# Patient Record
Sex: Female | Born: 1935
Health system: Southern US, Community
[De-identification: ages and names within clinical notes are randomized; demographics above are authoritative.]

## PROBLEM LIST (undated history)

## (undated) DIAGNOSIS — E559 Vitamin D deficiency, unspecified: Secondary | ICD-10-CM

## (undated) DIAGNOSIS — K222 Esophageal obstruction: Secondary | ICD-10-CM

## (undated) DIAGNOSIS — H9190 Unspecified hearing loss, unspecified ear: Secondary | ICD-10-CM

## (undated) DIAGNOSIS — C801 Malignant (primary) neoplasm, unspecified: Secondary | ICD-10-CM

## (undated) DIAGNOSIS — C189 Malignant neoplasm of colon, unspecified: Secondary | ICD-10-CM

## (undated) DIAGNOSIS — I4719 Other supraventricular tachycardia: Secondary | ICD-10-CM

## (undated) DIAGNOSIS — R42 Dizziness and giddiness: Secondary | ICD-10-CM

## (undated) DIAGNOSIS — I4891 Unspecified atrial fibrillation: Secondary | ICD-10-CM

## (undated) DIAGNOSIS — I878 Other specified disorders of veins: Secondary | ICD-10-CM

## (undated) DIAGNOSIS — I471 Supraventricular tachycardia: Secondary | ICD-10-CM

## (undated) DIAGNOSIS — K635 Polyp of colon: Secondary | ICD-10-CM

## (undated) DIAGNOSIS — C55 Malignant neoplasm of uterus, part unspecified: Secondary | ICD-10-CM

## (undated) DIAGNOSIS — K449 Diaphragmatic hernia without obstruction or gangrene: Secondary | ICD-10-CM

## (undated) DIAGNOSIS — L111 Transient acantholytic dermatosis [Grover]: Secondary | ICD-10-CM

## (undated) DIAGNOSIS — C3490 Malignant neoplasm of unspecified part of unspecified bronchus or lung: Secondary | ICD-10-CM

## (undated) DIAGNOSIS — I10 Essential (primary) hypertension: Secondary | ICD-10-CM

## (undated) DIAGNOSIS — M810 Age-related osteoporosis without current pathological fracture: Secondary | ICD-10-CM

## (undated) DIAGNOSIS — H8110 Benign paroxysmal vertigo, unspecified ear: Secondary | ICD-10-CM

## (undated) DIAGNOSIS — I509 Heart failure, unspecified: Secondary | ICD-10-CM

## (undated) DIAGNOSIS — R55 Syncope and collapse: Secondary | ICD-10-CM

## (undated) DIAGNOSIS — IMO0002 Reserved for concepts with insufficient information to code with codable children: Secondary | ICD-10-CM

## (undated) HISTORY — DX: Malignant (primary) neoplasm, unspecified: C80.1

## (undated) HISTORY — DX: Other specified disorders of veins: I87.8

## (undated) HISTORY — PX: VEIN LIGATION AND STRIPPING: SHX2653

## (undated) HISTORY — PX: COLECTOMY: SHX59

## (undated) HISTORY — PX: LOBECTOMY: SHX5089

## (undated) HISTORY — DX: Dizziness and giddiness: R42

## (undated) HISTORY — PX: OTHER SURGICAL HISTORY: SHX169

## (undated) HISTORY — PX: INNER EAR SURGERY: SHX679

## (undated) HISTORY — DX: Malignant neoplasm of uterus, part unspecified: C55

## (undated) HISTORY — PX: TOTAL KNEE ARTHROPLASTY: SHX125

## (undated) HISTORY — DX: Esophageal obstruction: K22.2

## (undated) HISTORY — PX: HEMORRHOID SURGERY: SHX153

## (undated) HISTORY — DX: Supraventricular tachycardia: I47.1

## (undated) HISTORY — DX: Malignant neoplasm of colon, unspecified: C18.9

## (undated) HISTORY — DX: Polyp of colon: K63.5

## (undated) HISTORY — DX: Syncope and collapse: R55

## (undated) HISTORY — DX: Other supraventricular tachycardia: I47.19

## (undated) HISTORY — PX: CATARACT EXTRACTION: SUR2

## (undated) HISTORY — PX: TOTAL ABDOMINAL HYSTERECTOMY: SHX209

## (undated) HISTORY — DX: Diaphragmatic hernia without obstruction or gangrene: K44.9

## (undated) HISTORY — DX: Reserved for concepts with insufficient information to code with codable children: IMO0002

---

## 1983-08-25 DIAGNOSIS — C55 Malignant neoplasm of uterus, part unspecified: Secondary | ICD-10-CM

## 1983-08-25 DIAGNOSIS — C801 Malignant (primary) neoplasm, unspecified: Secondary | ICD-10-CM

## 1983-08-25 HISTORY — DX: Malignant neoplasm of uterus, part unspecified: C55

## 1983-08-25 HISTORY — DX: Malignant (primary) neoplasm, unspecified: C80.1

## 1991-08-25 HISTORY — PX: LOBECTOMY: SHX5089

## 1998-01-02 ENCOUNTER — Other Ambulatory Visit: Admission: RE | Admit: 1998-01-02 | Discharge: 1998-01-02 | Payer: Self-pay | Admitting: Oncology

## 1998-07-03 ENCOUNTER — Other Ambulatory Visit: Admission: RE | Admit: 1998-07-03 | Discharge: 1998-07-03 | Payer: Self-pay | Admitting: Internal Medicine

## 1998-08-24 HISTORY — PX: REPLACEMENT TOTAL KNEE: SUR1224

## 1999-04-02 ENCOUNTER — Ambulatory Visit (HOSPITAL_COMMUNITY): Admission: RE | Admit: 1999-04-02 | Discharge: 1999-04-02 | Payer: Self-pay | Admitting: *Deleted

## 1999-04-02 ENCOUNTER — Encounter: Payer: Self-pay | Admitting: *Deleted

## 1999-07-07 ENCOUNTER — Other Ambulatory Visit: Admission: RE | Admit: 1999-07-07 | Discharge: 1999-07-07 | Payer: Self-pay | Admitting: Internal Medicine

## 1999-09-24 ENCOUNTER — Encounter: Payer: Self-pay | Admitting: Specialist

## 1999-09-30 ENCOUNTER — Encounter (INDEPENDENT_AMBULATORY_CARE_PROVIDER_SITE_OTHER): Payer: Self-pay

## 1999-09-30 ENCOUNTER — Inpatient Hospital Stay (HOSPITAL_COMMUNITY): Admission: RE | Admit: 1999-09-30 | Discharge: 1999-10-05 | Payer: Self-pay | Admitting: Specialist

## 2000-07-08 ENCOUNTER — Other Ambulatory Visit: Admission: RE | Admit: 2000-07-08 | Discharge: 2000-07-08 | Payer: Self-pay | Admitting: Internal Medicine

## 2000-08-24 DIAGNOSIS — K635 Polyp of colon: Secondary | ICD-10-CM

## 2000-08-24 HISTORY — DX: Polyp of colon: K63.5

## 2001-06-13 ENCOUNTER — Ambulatory Visit (HOSPITAL_COMMUNITY): Admission: RE | Admit: 2001-06-13 | Discharge: 2001-06-13 | Payer: Self-pay | Admitting: Internal Medicine

## 2004-06-27 ENCOUNTER — Ambulatory Visit: Payer: Self-pay | Admitting: Gastroenterology

## 2004-07-09 ENCOUNTER — Ambulatory Visit: Payer: Self-pay | Admitting: Gastroenterology

## 2005-08-24 DIAGNOSIS — K222 Esophageal obstruction: Secondary | ICD-10-CM

## 2005-08-24 DIAGNOSIS — K449 Diaphragmatic hernia without obstruction or gangrene: Secondary | ICD-10-CM

## 2005-08-24 HISTORY — DX: Esophageal obstruction: K22.2

## 2005-08-24 HISTORY — DX: Diaphragmatic hernia without obstruction or gangrene: K44.9

## 2006-04-16 ENCOUNTER — Ambulatory Visit: Payer: Self-pay | Admitting: Gastroenterology

## 2006-04-20 ENCOUNTER — Ambulatory Visit (HOSPITAL_COMMUNITY): Admission: RE | Admit: 2006-04-20 | Discharge: 2006-04-20 | Payer: Self-pay | Admitting: Gastroenterology

## 2006-05-03 ENCOUNTER — Ambulatory Visit: Payer: Self-pay | Admitting: Gastroenterology

## 2006-05-12 ENCOUNTER — Ambulatory Visit: Payer: Self-pay | Admitting: Gastroenterology

## 2006-12-17 ENCOUNTER — Ambulatory Visit: Payer: Self-pay | Admitting: Gastroenterology

## 2006-12-20 ENCOUNTER — Encounter: Payer: Self-pay | Admitting: Gastroenterology

## 2006-12-30 ENCOUNTER — Ambulatory Visit: Payer: Self-pay | Admitting: Gastroenterology

## 2006-12-30 LAB — CONVERTED CEMR LAB
Folate: >20 ng/mL
Tissue Transglutaminase Ab, IgA: <3 units (ref <5)
Vitamin B-12: 510 pg/mL (ref 211–911)

## 2007-01-11 ENCOUNTER — Ambulatory Visit: Payer: Self-pay | Admitting: Gastroenterology

## 2007-03-14 ENCOUNTER — Ambulatory Visit: Payer: Self-pay | Admitting: Vascular Surgery

## 2009-06-21 ENCOUNTER — Encounter: Admission: RE | Admit: 2009-06-21 | Discharge: 2009-06-21 | Payer: Self-pay | Admitting: Internal Medicine

## 2009-07-26 ENCOUNTER — Encounter: Admission: RE | Admit: 2009-07-26 | Discharge: 2009-07-26 | Payer: Self-pay | Admitting: Internal Medicine

## 2010-04-25 ENCOUNTER — Ambulatory Visit: Payer: Self-pay | Admitting: Cardiology

## 2011-01-06 NOTE — Procedures (Signed)
DUPLEX DEEP VENOUS EXAM - LOWER EXTREMITY   INDICATION:  Right lower extremity pain and swelling.   HISTORY:  Edema:  Yes, right lower extremity.  Trauma/Surgery:  Right greater saphenous vein tie-off.  Pain:  Yes  PE:  No  Previous DVT:  No  Anticoagulants:  No  Other:   DUPLEX EXAM:                CFV   SFV   PopV  PTV    GSV                R  L  R  L  R  L  R   L  R  L  Thrombosis    o  o  o  o  o  o  o   o  o  o  Spontaneous   +  +  +  +  +  +  +   +  +  +  Phasic        +  +  +  +  +  +  +   +  +  +  Augmentation  +  +  +  +  +  +  +   +  +  +  Compressible  +  +  +  +  +  +  +   +  +  +  Competent     +  +  +  +  +  +  +   +  +  +   Legend:  + - yes  o - no  p - partial  D - decreased   IMPRESSION:  1. The bilateral lower extremity deep venous system was imaged,      Dopplered, and shows no evidence of deep vein thrombosis.  2. Evidence of acute superficial thrombus in right vein of Giacomini      off of the greater saphenous vein from mid thigh throughout lesser      saphenous vein as well.    _____________________________  Di Kindle. Edilia Bo, M.D.   AS/MEDQ  D:  03/14/2007  T:  03/14/2007  Job:  [id number]

## 2011-01-06 NOTE — Assessment & Plan Note (Signed)
[name]                         GASTROENTEROLOGY OFFICE NOTE   NAME:[name], [name]              MRN:          [id number]  DATE:01/11/2007                            DOB:          August 12, 1924    [name] continues to complain of abdominal gas and bloating but not true  diarrhea. Simple attempts to treat her with Align probiotic therapy were  unsuccessful. Lab data from last visit showed normal B12 and folate  levels.   I still think this patient most likely has bacterial overgrowth syndrome  related to her subtotal colectomy. I have convinced her I think to take  Xifaxan 400 mg t.i.d. for 10 days for continued probiotic therapy and  will continue this as before. She is to continue her other medications  and followup with Dr. Jacky Kindle as previously planned.     Vania Rea. Jarold Motto, MD, Caleen Essex, FAGA  Electronically Signed    DRP/MedQ  DD: 01/11/2007  DT: 01/11/2007  Job #: [id number]   cc:   Geoffry Paradise, M.D.

## 2011-01-06 NOTE — Assessment & Plan Note (Signed)
[facility]                         GASTROENTEROLOGY OFFICE NOTE   NAME:[name], [name]              MRN:          [id number]  DATE:12/30/2006                            DOB:          [birth year]    HISTORY OF PRESENT ILLNESS:  [name] is an 77 year old white female  resident of [facility].  She is self-referred today for evaluation of  excessive abdominal gas, bloating, excessive flatus with bowel  movements.   I have known [name] for many years, and she has had several malignancies  including a hysterectomy for superficial endometrial cancer in 1985,  right upper lobe lobectomy in December 1993 for T2N0M0 adenocarcinoma  and status post subtotal colectomy in December 1994 for a B2 cecal  lesion as well as a polyp in the distal transverse colon with sever  atypia.  She has been followed by Dr. Jacky Kindle and Dr. Marikay Alar Magrinat for  many years.   [name]'s last colonoscopy was in November 2002 and was unremarkable with  evidence of a subtotal colectomy.  Another colonoscopy was done in  November 2005 and was negative.  I did perform endoscopy in September  2007 because of acid reflux symptoms and dysphagia, and she had a peptic  stricture of her esophagus and as dilated.  Over the last year, she has  complained of excessive abdominal gas, bloating, passage of explosive  flatus.  She has tried over-the-counter lactobacillus preparation  without improvement.  She denies steatorrhea, weight loss or other  gastrointestinal complaints.  She follows a regular diet.  She says she  does not eat gaseous substances.  She suffers from osteoporosis, is on  Os-Cal and Fosamax.  Her recent stool exam showed a normal culture,  negative exam for parasites and a negative C. difficile toxin.   She saw Dr. Jacky Kindle yesterday with a salivary gland infection on her  right parotid area and is on doxycycline b.i.d. for 10 days.  She  otherwise denies recent or  frequent antibiotic use.  She also, today,  denies and cardiopulmonary complaints.  She does suffer from [name]  disease which is apparently a pruritus macular papular-type rash which  was biopsied by Dr. Dorinda Hill.  She denies other dermatologic  problems.  She is on no other medications at this time except for  multivitamins.   PHYSICAL EXAMINATION:  GENERAL:  Healthy-appearing white female,  appearing her stated age, in no distress.  VITAL SIGNS:  Weight 134 pounds which she says is her normal weight.  Blood pressure 140/82, pulse 68 and regular.  ABDOMEN:  Flat and nontender without hepatosplenomegaly or masses.  There was a midline abdominal scar that was well-healed.  Bowel sounds  were normal.  RECTAL:  Deferred.   ASSESSMENT:  I suspect that [name] has bacterial overgrowth syndrome  associated with her subtotal colectomy.  As mentioned above, stool exams  have otherwise been negative.  She certainly has no other reason to have  chronic malabsorption, but we will consider celiac disease.   RECOMMENDATIONS:  1. Check B-12 level and celiac profile.  2. Finish 10 days of doxycycline along with Align Probiotic Therapy.  3.  If diarrhea and gas persist after doxycycline therapy, I have asked      her to take Xifaxin 400 mg t.i.d. for 10 days along with Align      Probiotic Therapy.  4. Low gas diet and usual restrictions of nonabsorbable      carbohydrates in her diet.     Vania Rea. Jarold Motto, MD, Caleen Essex, FAGA  Electronically Signed    DRP/MedQ  DD: 12/30/2006  DT: 12/30/2006  Job #: [id number]   cc:   Geoffry Paradise, M.D.  Dorinda Hill, M.D.  Valentino Hue. Magrinat, M.D.

## 2011-01-09 NOTE — Assessment & Plan Note (Signed)
[facility]                           GASTROENTEROLOGY OFFICE NOTE   NAME:Bistline, [name]              MRN:          [id number]  DATE:04/16/2006                            DOB:          1923/10/29    [name] is an 77 year old retired white female referred for evaluation of  dysphagia.   [name], in retrospect, has a long history of some coughing up of  partially digested food products on periodic occasions.  Since February 21, 2006, she has had dysphagia for solids in her retropharyngeal area but not  for liquids.  She denies reflux symptoms or any upper GI complaints  whatsoever.  She has no anorexia or weight loss.  She does not smoke but  uses two glasses of wine a day.  She has not had previous endoscopies or  barium studies.  She does have trouble swallowing her Fosamax pills, has  quit taking her Os-Cal pills because of the dysphagia.   This patient has had multiple cancers in the past and has had a previous  right upper lobe lobectomy in December 1993 for T2, N0 adenocarcinoma and  also a subtotal colectomy in 1994, for B2 cecal lesion.  She has had no  adjuvant therapy given.  She also had a hysterectomy in 1985 for a  superficial endometrial carcinoma.  She is followed closely by Dr. Jacky Kindle.  Her last colonoscopy exam was in November 2005, and showed a subtotal  colectomy that otherwise was normal.   PAST MEDICAL HISTORY:  Otherwise noncontributory except as mentioned above.   PAST SURGICAL HISTORY:  She has had a previous appendectomy.   FAMILY HISTORY:  Entirely noncontributory.   SOCIAL HISTORY:  Patient is married and lives with her husband at [facility]. She has a high school education.  She does not smoke but uses two  glasses of red wine a day.   REVIEW OF SYSTEMS:  Otherwise noncontributory except for some complaints of  some excessive thirst.  She denies cardiovascular or neurologic or pulmonary  complaints at this time.  She has never had CVA, strokes, suffers from no  known neuromuscular diseases.  She says her appetite is good and her weight  has been stable.   PHYSICAL EXAMINATION:  GENERAL APPEARANCE:  She is an elderly-appearing  white female who is hard of hearing but is in no acute distress.  VITAL SIGNS:  She is 5 feet 2 inches tall and weighs 129 pounds.  Blood  pressure 146/76 and pulse 78 and regular.  HEENT:  Examination of the oropharynx was somewhat difficult because of the  rather marked gag reflex, but I could not appreciate any definite masses or  lesions.  NECK:  Examination was unremarkable without thyromegaly or masses noted.  CHEST:  Entirely clear.  CARDIOVASCULAR:  She appeared to be in a regular rhythm without significant  murmurs, rubs, or gallops.  ABDOMEN:  I could not appreciate hepatosplenomegaly, abdominal masses or  tenderness.  Bowel sounds were normal.  EXTREMITIES:  Her upper extremities were unremarkable.  NEUROLOGIC:  Mental status was clear.   ASSESSMENT:  I think that Mrs.  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  However, with her history of multiple malignancies, it is  certainly possible that she could have esophageal carcinoma and she  definitely needs further evaluation.   RECOMMENDATIONS:  We have gone ahead and set her up for cine esophagogram  and barium swallow, proceed accordingly.  She may well need endoscopic exam  and dilation.  Discontinue her other medications and follow up with Dr.  Jacky Kindle as previously planned.                                   Vania Rea. Jarold Motto, MD, Clementeen Graham, [place]   DRP/MedQ  DD:  04/16/2006  DT:  04/16/2006  Job #:  [id number]   cc:   Geoffry Paradise, MD

## 2011-03-18 ENCOUNTER — Other Ambulatory Visit: Payer: Self-pay | Admitting: Cardiology

## 2011-03-18 NOTE — Telephone Encounter (Signed)
escribe medication per fax request  

## 2011-05-11 ENCOUNTER — Encounter: Payer: Self-pay | Admitting: Cardiology

## 2011-05-13 ENCOUNTER — Ambulatory Visit: Payer: Self-pay | Admitting: Cardiology

## 2011-05-20 ENCOUNTER — Ambulatory Visit (INDEPENDENT_AMBULATORY_CARE_PROVIDER_SITE_OTHER): Payer: Medicare Other | Admitting: Cardiology

## 2011-05-20 ENCOUNTER — Encounter: Payer: Self-pay | Admitting: Cardiology

## 2011-05-20 VITALS — BP 130/82 | HR 80 | Ht 60.0 in | Wt 123.4 lb

## 2011-05-20 DIAGNOSIS — I471 Supraventricular tachycardia: Secondary | ICD-10-CM | POA: Insufficient documentation

## 2011-05-20 DIAGNOSIS — I4719 Other supraventricular tachycardia: Secondary | ICD-10-CM | POA: Insufficient documentation

## 2011-05-20 NOTE — Progress Notes (Signed)
   [name] Date of Birth: [birth year]   History of Present Illness: [name] is seen for yearly followup. She has a history of PAT. She has been on beta blockers for the last 3 years with no recurrence. She is now 77 years old. She is doing well. She has had no new medical problems this past year. She still complains of some intermittent vertigo which has been a long-standing problem.  Current Outpatient Prescriptions on File Prior to Visit  Medication Sig Dispense Refill  . Acetaminophen (TYLENOL PO) Take by mouth as needed.        Marland Kitchen aspirin 81 MG tablet Take 81 mg by mouth daily.        . Calcium Carbonate Antacid (TUMS PO) Take by mouth.        . Calcium Carbonate-Vitamin D (OSCAL 500/200 D-3 PO) Take by mouth.        Marland Kitchen lisinopril (PRINIVIL,ZESTRIL) 20 MG tablet Take 20 mg by mouth daily.        . metoprolol tartrate (LOPRESSOR) 25 MG tablet TAKE 1/2 TABLET DAILY  15 tablet  5  . Multiple Vitamin (MULTI-VITAMIN PO) Take by mouth.          Allergies  Allergen Reactions  . Adhesive (Tape)   . Celebrex (Celecoxib)   . Neomycin   . Penicillins Hives    Past Medical History  Diagnosis Date  . Atrial tachycardia     paroxysmal  . Venous stasis     chronic  . Cancer     Lung--Status post right upper lobectomy  . Uterine cancer     complete hysterectomy  . Colon cancer     Colectomy  . Syncope and collapse     Pre-syncope  . Vertigo   . Compression fracture     Past Surgical History  Procedure Date  . Lobectomy     Right  . Colectomy   . Total knee arthroplasty   . Cataract extraction   . Inner ear surgery   . Vein ligation and stripping   . Total abdominal hysterectomy   . Esophageal dilitation     History  Smoking status  . Unknown If Ever Smoked  Smokeless tobacco  . Not on file    History  Alcohol Use  . Yes    Family History  Problem Relation Age of Onset  . Asthma Father   . Emphysema Brother   . Emphysema Brother     Review  of Systems: The review of systems is positive for history of compression fractures spine. She has intermittent vertigo. All other systems were reviewed and are negative.  Physical Exam: BP 130/82  Pulse 80  Ht 5' (1.524 m)  Wt 123 lb 6.4 oz (55.974 kg)  BMI 24.10 kg/m2 She is a pleasant elderly white female in no acute distress. Her HEENT exam is unremarkable. She has no JVD or bruits. Lungs are clear. Cardiac exam reveals a soft apical systolic murmur. Abdomen is soft and nontender. She has no edema. She has multiple superficial varicosities in her lower extremities. Pedal pulses are good. Her neurologic exam is remarkable for difficulty hearing. LABORATORY DATA:   Assessment / Plan:

## 2011-05-20 NOTE — Assessment & Plan Note (Signed)
She has had no recurrence for the past 77 years old metoprolol. Her blood pressure is in excellent control. I've recommended she continue on her current therapy indefinitely and I'll see her back as needed.

## 2011-05-20 NOTE — Patient Instructions (Addendum)
Continue your current medications.  Avoid caffeine.  I will see you again as needed.

## 2013-01-06 ENCOUNTER — Encounter: Payer: Self-pay | Admitting: Gastroenterology

## 2013-01-10 ENCOUNTER — Encounter: Payer: Self-pay | Admitting: *Deleted

## 2013-01-13 ENCOUNTER — Ambulatory Visit (INDEPENDENT_AMBULATORY_CARE_PROVIDER_SITE_OTHER): Payer: Medicare Other | Admitting: Gastroenterology

## 2013-01-13 ENCOUNTER — Encounter: Payer: Self-pay | Admitting: Gastroenterology

## 2013-01-13 VITALS — BP 136/78 | HR 67 | Ht 60.0 in | Wt 123.4 lb

## 2013-01-13 DIAGNOSIS — R195 Other fecal abnormalities: Secondary | ICD-10-CM

## 2013-01-13 DIAGNOSIS — Z9049 Acquired absence of other specified parts of digestive tract: Secondary | ICD-10-CM

## 2013-01-13 DIAGNOSIS — Z85048 Personal history of other malignant neoplasm of rectum, rectosigmoid junction, and anus: Secondary | ICD-10-CM

## 2013-01-13 DIAGNOSIS — Z9889 Other specified postprocedural states: Secondary | ICD-10-CM

## 2013-01-13 DIAGNOSIS — K6389 Other specified diseases of intestine: Secondary | ICD-10-CM

## 2013-01-13 MED ORDER — RIFAXIMIN 550 MG PO TABS: 550.0000 mg | ORAL_TABLET | Freq: Two times a day (BID) | ORAL | Status: DC

## 2013-01-13 NOTE — Patient Instructions (Addendum)
You have been scheduled for a flexible sigmoidoscopy. Please follow the written instructions given to you at your visit today. If you use inhalers (even only as needed), please bring them with you on the day of your procedure.   Please purchase Align over the counter. This puts good bacteria back into your colon. You should take 1 capsule by mouth once daily for ten days.  We have given you samples of the following medication to take: Xifaxan, please take one tablet by mouth twice daily for ten days.

## 2013-01-13 NOTE — Progress Notes (Signed)
History of Present Illness:  This is a mildly demented 79 year old Caucasian female who has had subtotal colectomy in 1994 for cecal colon carcinoma.  She's had several followup colonoscopy which has shown a small amount of rectosigmoid remaining which has been normal,last exam 2005.  Really denies any GI complaints except for gas, bloating, which is been present ever since her surgery and has been documented to be secondary to bacterial overgrowth syndrome related to her right colectomy and anatomical changes.  She has responded well in the past to Xifaxan and oral antibiotics,but I have not seen her in several years..  She denies melena but occasionally have a tinge of blood if she strains and feels a" hemorrhoids".  Recent hemoglobin and hematocrit were normal, and there is questionable trace guaiac positive stool Hemoccult.  She is on multiple medications listed and reviewed her chart.  Her appetite is good and weight is stable.  He denies upper GI or hepatobiliary complaints.  Previous endoscopy in 2007 showed a small hiatal hernia.  The patient also has had previous right upper lobe lobectomy in 1993 for adenocarcinoma, and her cecal lesion was a B-2, she's had a hysterectomy in 1985 for endometrial carcinoma, and suffers from hypertension.  I have reviewed this patient's present history, medical and surgical past history, allergies and medications.     ROS:   All systems were reviewed and are negative unless otherwise stated in the HPI.    Physical Exam: At pressure 130/78 pulse 67 and regular weight 123 with a BMI of 24.1. General well developed well nourished patient in no acute distress, appearing their stated age Eyes PERRLA, no icterus, fundoscopic exam per opthamologist Skin no lesions noted Neck supple, no adenopathy, no thyroid enlargement, no tenderness Chest clear to percussion and auscultation Heart no significant murmurs, gallops or rubs noted Abdomen no hepatosplenomegaly masses  or tenderness, BS normal.  Rectal inspection normal no fissures, or fistulae noted.  No masses or tenderness on digital exam. Stool guaiac negative. Extremities no acute joint lesions, edema, phlebitis or evidence of cellulitis. Neurologic patient oriented x 3, cranial nerves intact, no focal neurologic deficits noted. Psychological mental status normal and normal affect.  Mild dementia   Assessment and plan: Probable false positive Hemoccult card.  Stool today is entirely negative and I did add physical exam and rectal exam which were normal.  She is having almost all of her colon removed, and I do not feel at her age with a normal hemoglobin and hematocrit that she needs followup "colonoscopy",but have scheduled flexible sigmoidoscopy which should be adequate to see her rectosigmoid area.  She is on aspirin 81 mg a day which also can give a false positive stool.  For her bacterial overgrowth syndrome I have prescribe Xifaxan 550 mg twice a day for 10 days along with Align probiotics.  She is to call in 2 weeks' time for progress report in terms of her gas, bloating, and vague GI symptoms otherwise.  Past continue other medications by Dr. Jacky Kindle as listed and reviewed.  No diagnosis found.

## 2013-01-19 ENCOUNTER — Telehealth: Payer: Self-pay | Admitting: *Deleted

## 2013-01-19 NOTE — Telephone Encounter (Signed)
Patient had an appointment at 130 pm on 6-11 but Dr. Jarold Motto ask to hold that time because he has dr appointment, he will not be able to start until 2 pm.  I called patient to change appointment and patient said she is fine with coming in at 1pm for a 2 pm I advised patient that the day before procedure instructions are still the same. She does the fleet enema one hr prior(12 pm) to coming and stop clear liquids three hr(10 am) before procedure.  Patient has verbalized understanding and will call with any questions.

## 2013-02-01 ENCOUNTER — Other Ambulatory Visit: Payer: Medicare Other | Admitting: Gastroenterology

## 2013-02-01 ENCOUNTER — Ambulatory Visit (AMBULATORY_SURGERY_CENTER): Payer: Medicare Other | Admitting: Gastroenterology

## 2013-02-01 ENCOUNTER — Encounter: Payer: Self-pay | Admitting: Gastroenterology

## 2013-02-01 VITALS — BP 134/67 | HR 66 | Temp 98.5°F | Resp 22 | Ht 60.0 in | Wt 123.0 lb

## 2013-02-01 DIAGNOSIS — D126 Benign neoplasm of colon, unspecified: Secondary | ICD-10-CM

## 2013-02-01 DIAGNOSIS — Z85048 Personal history of other malignant neoplasm of rectum, rectosigmoid junction, and anus: Secondary | ICD-10-CM

## 2013-02-01 DIAGNOSIS — R197 Diarrhea, unspecified: Secondary | ICD-10-CM

## 2013-02-01 MED ORDER — SODIUM CHLORIDE 0.9 % IV SOLN: 500.0000 mL | INTRAVENOUS | Status: DC

## 2013-02-01 NOTE — Patient Instructions (Addendum)
Discharge instructions given with verbal understanding. Biopsies taken. Resume previous medications. YOU HAD AN ENDOSCOPIC PROCEDURE TODAY AT THE [facility]: Refer to the procedure report that was given to you for any specific questions about what was found during the examination.  If the procedure report does not answer your questions, please call your gastroenterologist to clarify.  If you requested that your care partner not be given the details of your procedure findings, then the procedure report has been included in a sealed envelope for you to review at your convenience later.  YOU SHOULD EXPECT: Some feelings of bloating in the abdomen. Passage of more gas than usual.  Walking can help get rid of the air that was put into your GI tract during the procedure and reduce the bloating. If you had a lower endoscopy (such as a colonoscopy or flexible sigmoidoscopy) you may notice spotting of blood in your stool or on the toilet paper. If you underwent a bowel prep for your procedure, then you may not have a normal bowel movement for a few days.  DIET: Your first meal following the procedure should be a light meal and then it is ok to progress to your normal diet.  A half-sandwich or bowl of soup is an example of a good first meal.  Heavy or fried foods are harder to digest and may make you feel nauseous or bloated.  Likewise meals heavy in dairy and vegetables can cause extra gas to form and this can also increase the bloating.  Drink plenty of fluids but you should avoid alcoholic beverages for 24 hours.  ACTIVITY: Your care partner should take you home directly after the procedure.  You should plan to take it easy, moving slowly for the rest of the day.  You can resume normal activity the day after the procedure however you should NOT DRIVE or use heavy machinery for 24 hours (because of the sedation medicines used during the test).    SYMPTOMS TO REPORT IMMEDIATELY: A gastroenterologist  can be reached at any hour.  During normal business hours, 8:30 AM to 5:00 PM Monday through Friday, call [phone].  After hours and on weekends, please call the GI answering service at [phone] who will take a message and have the physician on call contact you.   Following lower endoscopy (colonoscopy or flexible sigmoidoscopy):  Excessive amounts of blood in the stool  Significant tenderness or worsening of abdominal pains  Swelling of the abdomen that is new, acute  Fever of 100F or higher  FOLLOW UP: If any biopsies were taken you will be contacted by phone or by letter within the next 1-3 weeks.  Call your gastroenterologist if you have not heard about the biopsies in 3 weeks.  Our staff will call the home number listed on your records the next business day following your procedure to check on you and address any questions or concerns that you may have at that time regarding the information given to you following your procedure. This is a courtesy call and so if there is no answer at the home number and we have not heard from you through the emergency physician on call, we will assume that you have returned to your regular daily activities without incident.  SIGNATURES/CONFIDENTIALITY: You and/or your care partner have signed paperwork which will be entered into your electronic medical record.  These signatures attest to the fact that that the information above on your After Visit Summary has been reviewed   and is understood.  Full responsibility of the confidentiality of this discharge information lies with you and/or your care-partner.  

## 2013-02-01 NOTE — Progress Notes (Signed)
Patient did not experience any of the following events: a burn prior to discharge; a fall within the facility; wrong site/side/patient/procedure/implant event; or a hospital transfer or hospital admission upon discharge from the facility. (G8907) Patient did not have preoperative order for IV antibiotic SSI prophylaxis. (G8918)  

## 2013-02-01 NOTE — Op Note (Signed)
[facility] [address] [place], [address]   FLEXIBLE SIGMOIDOSCOPY PROCEDURE REPORT  PATIENT: [name], [name]  MR#: [id number] BIRTHDATE: [birth year] , 89  yrs. old GENDER: Female ENDOSCOPIST: Mardella Layman, MD, [facility] REFERRED BY: PROCEDURE DATE:  02/01/2013 PROCEDURE: ASA CLASS: INDICATIONS: MEDICATIONS:  DESCRIPTION OF PROCEDURE:   inspection of the rectum and perirectal area was unremarkable as was rectal exam.  The rectum was easily intubated with a flexible sigmoidoscope.  The ileocolonic anastomosis was at 25 cm and appeared normal.  There were no mucosal polypoid lesions in the remaining colon including retroflex view of the rectum.  Empiric biopsies were obtained for pathologic exam.  Patient was extubated without difficulty, tolerated this procedure well.        COMPLICATIONS:  None  ENDOSCOPIC IMPRESSION: S/P subtotal colectomy for colon cancer.Marland KitchenMarland KitchenR/O microscopic colitis.  RECOMMENDATIONS:Await biopsy report...continue other meds.   _______________________________ eSignedMardella Layman, MD, [facility] 02/01/2013 1:58 PM

## 2013-02-01 NOTE — Progress Notes (Signed)
Called to room to assist during endoscopic procedure.  Patient ID and intended procedure confirmed with present staff. Received instructions for my participation in the procedure from the performing physician.  

## 2013-02-01 NOTE — Progress Notes (Signed)
Propofol given over incremental dosages 

## 2013-02-02 ENCOUNTER — Telehealth: Payer: Self-pay

## 2013-02-02 NOTE — Telephone Encounter (Signed)
Left message on answering machine. 

## 2013-02-07 ENCOUNTER — Encounter: Payer: Self-pay | Admitting: Gastroenterology

## 2016-09-15 DIAGNOSIS — D0439 Carcinoma in situ of skin of other parts of face: Secondary | ICD-10-CM | POA: Diagnosis not present

## 2016-09-15 DIAGNOSIS — L570 Actinic keratosis: Secondary | ICD-10-CM | POA: Diagnosis not present

## 2016-12-16 DIAGNOSIS — D0439 Carcinoma in situ of skin of other parts of face: Secondary | ICD-10-CM | POA: Diagnosis not present

## 2016-12-16 DIAGNOSIS — Z85828 Personal history of other malignant neoplasm of skin: Secondary | ICD-10-CM | POA: Diagnosis not present

## 2017-01-13 DIAGNOSIS — R8299 Other abnormal findings in urine: Secondary | ICD-10-CM | POA: Diagnosis not present

## 2017-01-13 DIAGNOSIS — M810 Age-related osteoporosis without current pathological fracture: Secondary | ICD-10-CM | POA: Diagnosis not present

## 2017-01-13 DIAGNOSIS — I10 Essential (primary) hypertension: Secondary | ICD-10-CM | POA: Diagnosis not present

## 2017-01-20 DIAGNOSIS — Z6820 Body mass index (BMI) 20.0-20.9, adult: Secondary | ICD-10-CM | POA: Diagnosis not present

## 2017-01-20 DIAGNOSIS — C3490 Malignant neoplasm of unspecified part of unspecified bronchus or lung: Secondary | ICD-10-CM | POA: Diagnosis not present

## 2017-01-20 DIAGNOSIS — S22009S Unspecified fracture of unspecified thoracic vertebra, sequela: Secondary | ICD-10-CM | POA: Diagnosis not present

## 2017-01-20 DIAGNOSIS — I10 Essential (primary) hypertension: Secondary | ICD-10-CM | POA: Diagnosis not present

## 2017-01-20 DIAGNOSIS — Z0000 Encounter for general adult medical examination without abnormal findings: Secondary | ICD-10-CM | POA: Diagnosis not present

## 2017-01-20 DIAGNOSIS — Z1212 Encounter for screening for malignant neoplasm of rectum: Secondary | ICD-10-CM | POA: Diagnosis not present

## 2017-01-20 DIAGNOSIS — M810 Age-related osteoporosis without current pathological fracture: Secondary | ICD-10-CM | POA: Diagnosis not present

## 2017-01-20 DIAGNOSIS — C189 Malignant neoplasm of colon, unspecified: Secondary | ICD-10-CM | POA: Diagnosis not present

## 2017-01-20 DIAGNOSIS — Z1389 Encounter for screening for other disorder: Secondary | ICD-10-CM | POA: Diagnosis not present

## 2017-01-20 DIAGNOSIS — D692 Other nonthrombocytopenic purpura: Secondary | ICD-10-CM | POA: Diagnosis not present

## 2017-01-20 DIAGNOSIS — R413 Other amnesia: Secondary | ICD-10-CM | POA: Diagnosis not present

## 2017-01-20 DIAGNOSIS — C55 Malignant neoplasm of uterus, part unspecified: Secondary | ICD-10-CM | POA: Diagnosis not present

## 2017-01-27 DIAGNOSIS — D0439 Carcinoma in situ of skin of other parts of face: Secondary | ICD-10-CM | POA: Diagnosis not present

## 2017-06-30 DIAGNOSIS — Z961 Presence of intraocular lens: Secondary | ICD-10-CM | POA: Diagnosis not present

## 2017-06-30 DIAGNOSIS — H10413 Chronic giant papillary conjunctivitis, bilateral: Secondary | ICD-10-CM | POA: Diagnosis not present

## 2017-06-30 DIAGNOSIS — H43813 Vitreous degeneration, bilateral: Secondary | ICD-10-CM | POA: Diagnosis not present

## 2017-07-19 DIAGNOSIS — C55 Malignant neoplasm of uterus, part unspecified: Secondary | ICD-10-CM | POA: Diagnosis not present

## 2017-07-19 DIAGNOSIS — Z6820 Body mass index (BMI) 20.0-20.9, adult: Secondary | ICD-10-CM | POA: Diagnosis not present

## 2017-07-19 DIAGNOSIS — C3490 Malignant neoplasm of unspecified part of unspecified bronchus or lung: Secondary | ICD-10-CM | POA: Diagnosis not present

## 2017-07-19 DIAGNOSIS — D692 Other nonthrombocytopenic purpura: Secondary | ICD-10-CM | POA: Diagnosis not present

## 2017-07-19 DIAGNOSIS — C189 Malignant neoplasm of colon, unspecified: Secondary | ICD-10-CM | POA: Diagnosis not present

## 2017-07-19 DIAGNOSIS — R413 Other amnesia: Secondary | ICD-10-CM | POA: Diagnosis not present

## 2017-07-19 DIAGNOSIS — M810 Age-related osteoporosis without current pathological fracture: Secondary | ICD-10-CM | POA: Diagnosis not present

## 2017-07-19 DIAGNOSIS — I10 Essential (primary) hypertension: Secondary | ICD-10-CM | POA: Diagnosis not present

## 2017-08-07 ENCOUNTER — Emergency Department (HOSPITAL_COMMUNITY): Payer: PPO

## 2017-08-07 ENCOUNTER — Encounter (HOSPITAL_COMMUNITY): Payer: Self-pay

## 2017-08-07 ENCOUNTER — Observation Stay (HOSPITAL_COMMUNITY)
Admission: EM | Admit: 2017-08-07 | Discharge: 2017-08-09 | Disposition: A | Discharge status: Home / Self Care | Payer: PPO | Attending: Internal Medicine | Admitting: Internal Medicine

## 2017-08-07 DIAGNOSIS — R42 Dizziness and giddiness: Secondary | ICD-10-CM | POA: Insufficient documentation

## 2017-08-07 DIAGNOSIS — R0789 Other chest pain: Secondary | ICD-10-CM | POA: Diagnosis not present

## 2017-08-07 DIAGNOSIS — R011 Cardiac murmur, unspecified: Secondary | ICD-10-CM | POA: Insufficient documentation

## 2017-08-07 DIAGNOSIS — R079 Chest pain, unspecified: Secondary | ICD-10-CM | POA: Diagnosis not present

## 2017-08-07 DIAGNOSIS — I480 Paroxysmal atrial fibrillation: Secondary | ICD-10-CM | POA: Diagnosis not present

## 2017-08-07 DIAGNOSIS — I10 Essential (primary) hypertension: Secondary | ICD-10-CM | POA: Diagnosis present

## 2017-08-07 DIAGNOSIS — Z79899 Other long term (current) drug therapy: Secondary | ICD-10-CM | POA: Insufficient documentation

## 2017-08-07 HISTORY — DX: Transient acantholytic dermatosis (grover): L11.1

## 2017-08-07 HISTORY — DX: Age-related osteoporosis without current pathological fracture: M81.0

## 2017-08-07 LAB — CBC
HCT: 34.3 % — ABNORMAL LOW (ref 36.0–46.0)
Hemoglobin: 11.7 g/dL — ABNORMAL LOW (ref 12.0–15.0)
MCH: 32 pg (ref 26.0–34.0)
MCHC: 34.1 g/dL (ref 30.0–36.0)
MCV: 93.7 fL (ref 78.0–100.0)
Platelets: 178 10*3/uL (ref 150–400)
RBC: 3.66 MIL/uL — ABNORMAL LOW (ref 3.87–5.11)
RDW: 13.6 % (ref 11.5–15.5)
WBC: 5.4 10*3/uL (ref 4.0–10.5)

## 2017-08-07 LAB — I-STAT TROPONIN, ED: Troponin i, poc: 0 ng/mL (ref 0.00–0.08)

## 2017-08-07 LAB — BASIC METABOLIC PANEL
Anion gap: 7 (ref 5–15)
BUN: 12 mg/dL (ref 6–20)
CO2: 25 mmol/L (ref 22–32)
Calcium: 8.4 mg/dL — ABNORMAL LOW (ref 8.9–10.3)
Chloride: 98 mmol/L — ABNORMAL LOW (ref 101–111)
Creatinine, Ser: 0.57 mg/dL (ref 0.44–1.00)
GFR calc Af Amer: >60 mL/min (ref >60)
GFR calc non Af Amer: >60 mL/min (ref >60)
Glucose, Bld: 96 mg/dL (ref 65–99)
Potassium: 4 mmol/L (ref 3.5–5.1)
Sodium: 130 mmol/L — ABNORMAL LOW (ref 135–145)

## 2017-08-07 NOTE — ED Triage Notes (Signed)
[facility] that started about 15 mins prior to calling EMS. CP central non radiating. Ems gave 324 ASA, 2 nitros with no relief. Pt a fib on the monitor with hx of, not on any blood thinners. VSS, nad. Pt a.o

## 2017-08-08 ENCOUNTER — Other Ambulatory Visit: Payer: Self-pay

## 2017-08-08 ENCOUNTER — Encounter (HOSPITAL_COMMUNITY): Payer: Self-pay | Admitting: Radiology

## 2017-08-08 ENCOUNTER — Observation Stay (HOSPITAL_COMMUNITY): Payer: PPO

## 2017-08-08 DIAGNOSIS — R011 Cardiac murmur, unspecified: Secondary | ICD-10-CM | POA: Diagnosis present

## 2017-08-08 DIAGNOSIS — I480 Paroxysmal atrial fibrillation: Secondary | ICD-10-CM | POA: Diagnosis not present

## 2017-08-08 DIAGNOSIS — R079 Chest pain, unspecified: Secondary | ICD-10-CM | POA: Diagnosis not present

## 2017-08-08 DIAGNOSIS — I10 Essential (primary) hypertension: Secondary | ICD-10-CM | POA: Diagnosis not present

## 2017-08-08 DIAGNOSIS — R42 Dizziness and giddiness: Secondary | ICD-10-CM | POA: Diagnosis not present

## 2017-08-08 DIAGNOSIS — Z79899 Other long term (current) drug therapy: Secondary | ICD-10-CM | POA: Diagnosis not present

## 2017-08-08 DIAGNOSIS — R0789 Other chest pain: Secondary | ICD-10-CM | POA: Diagnosis not present

## 2017-08-08 LAB — T4, FREE: Free T4: 1.11 ng/dL (ref 0.61–1.12)

## 2017-08-08 LAB — TROPONIN I
Troponin I: <0.03 ng/mL (ref <0.03)
Troponin I: <0.03 ng/mL (ref <0.03)
Troponin I: <0.03 ng/mL (ref <0.03)

## 2017-08-08 LAB — TSH: TSH: 1.041 u[IU]/mL (ref 0.350–4.500)

## 2017-08-08 LAB — D-DIMER, QUANTITATIVE: D-Dimer, Quant: 1.68 ug/mL-FEU — ABNORMAL HIGH (ref 0.00–0.50)

## 2017-08-08 MED ORDER — NITROGLYCERIN 0.4 MG SL SUBL: 0.4000 mg | SUBLINGUAL_TABLET | SUBLINGUAL | Status: DC | PRN

## 2017-08-08 MED ORDER — ACETAMINOPHEN 325 MG PO TABS: 650.0000 mg | ORAL_TABLET | ORAL | Status: DC | PRN

## 2017-08-08 MED ORDER — ADULT MULTIVITAMIN W/MINERALS CH
1.0000 | ORAL_TABLET | Freq: Every day | ORAL | Status: DC
  Administered 2017-08-08 – 2017-08-09 (×2): 1 via ORAL
  Filled 2017-08-08 (×2): qty 1

## 2017-08-08 MED ORDER — ONDANSETRON HCL 4 MG/2ML IJ SOLN: 4.0000 mg | Freq: Four times a day (QID) | INTRAMUSCULAR | Status: DC | PRN

## 2017-08-08 MED ORDER — METOPROLOL TARTRATE 12.5 MG HALF TABLET
12.5000 mg | ORAL_TABLET | Freq: Every day | ORAL | Status: DC
Start: 2017-08-08 — End: 2017-08-09
  Administered 2017-08-08 – 2017-08-09 (×2): 12.5 mg via ORAL
  Filled 2017-08-08 (×2): qty 1

## 2017-08-08 MED ORDER — ENOXAPARIN SODIUM 40 MG/0.4ML ~~LOC~~ SOLN
40.0000 mg | SUBCUTANEOUS | Status: DC
  Administered 2017-08-08 – 2017-08-09 (×2): 40 mg via SUBCUTANEOUS
  Filled 2017-08-08 (×2): qty 0.4

## 2017-08-08 MED ORDER — LISINOPRIL 20 MG PO TABS
20.0000 mg | ORAL_TABLET | Freq: Every day | ORAL | Status: DC
Start: 2017-08-08 — End: 2017-08-09
  Administered 2017-08-08 – 2017-08-09 (×2): 20 mg via ORAL
  Filled 2017-08-08 (×2): qty 1

## 2017-08-08 MED ORDER — LABETALOL HCL 5 MG/ML IV SOLN
5.0000 mg | INTRAVENOUS | Status: DC | PRN
  Administered 2017-08-08: 5 mg via INTRAVENOUS
  Filled 2017-08-08: qty 4

## 2017-08-08 MED ORDER — IOPAMIDOL (ISOVUE-370) INJECTION 76%
INTRAVENOUS | Status: AC
  Administered 2017-08-08: 100 mL
  Filled 2017-08-08: qty 100

## 2017-08-08 MED ORDER — ASPIRIN 81 MG PO CHEW
81.0000 mg | CHEWABLE_TABLET | Freq: Every day | ORAL | Status: DC
  Administered 2017-08-08 – 2017-08-09 (×2): 81 mg via ORAL
  Filled 2017-08-08 (×2): qty 1

## 2017-08-08 MED ORDER — VITAMIN D 1000 UNITS PO TABS
1000.0000 [IU] | ORAL_TABLET | Freq: Every day | ORAL | Status: DC
  Administered 2017-08-08 – 2017-08-09 (×2): 1000 [IU] via ORAL
  Filled 2017-08-08 (×2): qty 1

## 2017-08-08 NOTE — ED Provider Notes (Signed)
[facility] EMERGENCY DEPARTMENT Provider Note   CSN: [id number] Arrival date & time: 08/07/17  2213     History   Chief Complaint Chief Complaint  Patient presents with  . Chest Pain    HPI [name] is a 83 y.o. female.  Patient is a 30-yo with history of paroxysmal atrial fibrillation, Grover's disease, colon CA s/p colectomy, osteoporosis who presents with chest tightness, lightheadedness that started just prior to arrival. She is a resident at [facility] with her husband who is at bedside and contributes to history. No vomiting tonight, no nausea or diaphoresis. She and husband report the lightheadedness is similar to when she goes into a-fib, as found on EKG per EMS, but she denies history of chest discomfort. No recent fever, cough. There was no syncope tonight, no fall or injury. The patient reports that NTG given did not initially improve her pain but it has eased off over time.    The history is provided by the patient. No language interpreter was used.    Past Medical History:  Diagnosis Date  . Cancer ([place]) 1985   uterine  . Cancer ([place]) 1993   lung with right upper lobectomy  . Cancer ([place]) 1994   colon  . Grover's disease   . Osteoporosis   . Vertigo     There are no active problems to display for this patient.   Past Surgical History:  Procedure Laterality Date  . LOBECTOMY Right 1993  . REPLACEMENT TOTAL KNEE Left 2000    OB History    No data available       Home Medications    Prior to Admission medications   Medication Sig Start Date End Date Taking? Authorizing Provider  aspirin 81 MG chewable tablet Chew 81 mg by mouth daily.   Yes [provider]  Cholecalciferol (VITAMIN D3) 1000 units CHEW Chew 1 tablet by mouth daily.   Yes [provider]  lisinopril (PRINIVIL,ZESTRIL) 20 MG tablet Take 20 mg by mouth daily. 06/01/17  Yes [provider]  metoprolol tartrate (LOPRESSOR) 25 MG  tablet Take 12.5 mg by mouth daily. 07/20/17  Yes [provider]  Multiple Vitamin (MULTIVITAMIN) tablet Take 1 tablet by mouth daily. gummie   Yes [provider]    Family History No family history on file.  Social History Social History   Tobacco Use  . Smoking status: Not on file  Substance Use Topics  . Alcohol use: No    Frequency: Never  . Drug use: No     Allergies   Celebrex [celecoxib]; Merthiolate [thimerosal]; Neomycin; and Penicillins   Review of Systems Review of Systems  Constitutional: Negative for chills and fever.  HENT: Negative.   Respiratory: Positive for chest tightness. Negative for shortness of breath.   Cardiovascular: Positive for chest pain.  Gastrointestinal: Negative.  Negative for nausea.  Musculoskeletal: Negative.   Skin: Negative.   Neurological: Positive for light-headedness. Negative for syncope.     Physical Exam Updated Vital Signs BP (!) 196/95   Pulse 69   Temp 97.7 F (36.5 C) (Oral)   Resp 20   SpO2 96%   Physical Exam  Constitutional: She is oriented to person, place, and time. She appears well-developed and well-nourished.  HENT:  Head: Normocephalic.  Neck: Normal range of motion. Neck supple.  Cardiovascular: Normal rate and regular rhythm.  Pulmonary/Chest: Effort normal and breath sounds normal. She has no wheezes. She has no rhonchi.  Abdominal: Soft. Bowel sounds are normal. There is no tenderness. There is no rebound and no guarding.  Musculoskeletal: Normal range of motion.       Right lower leg: She exhibits no edema.       Left lower leg: She exhibits no edema.  Neurological: She is alert and oriented to person, place, and time.  Skin: Skin is warm and dry.  Psychiatric: She has a normal mood and affect.     ED Treatments / Results  Labs (all labs ordered are listed, but only abnormal results are displayed) Labs Reviewed  BASIC METABOLIC PANEL - Abnormal; Notable for the following  components:      Result Value   Sodium 130 (*)    Chloride 98 (*)    Calcium 8.4 (*)    All other components within normal limits  CBC - Abnormal; Notable for the following components:   RBC 3.66 (*)    Hemoglobin 11.7 (*)    HCT 34.3 (*)    All other components within normal limits  I-STAT TROPONIN, ED    EKG  EKG Interpretation  Date/Time:  Saturday August 07 2017 22:26:03 EST Ventricular Rate:  72 PR Interval:    QRS Duration: 94 QT Interval:  405 QTC Calculation: 444 R Axis:   98 Text Interpretation:  Sinus rhythm Left atrial enlargement Right axis deviation Consider left ventricular hypertrophy NO STEMI No old tracing to compare Confirmed by Addison Lank [phone]) on 08/07/2017 10:35:48 PM       Radiology Dg Chest 2 View  Result Date: 08/07/2017 CLINICAL DATA:  Chest pain and dyspnea this evening. Atrial fibrillation. EXAM: CHEST  2 VIEW COMPARISON:  07/26/2009 FINDINGS: Chronic stable cardiomegaly with aortic atherosclerosis. Mild central vascular congestion. No overt pulmonary edema or pulmonary consolidations. Slight blunting left costophrenic angle may be secondary to minimal effusion or pleural thickening. Stable thoracic kyphosis with chronic stable marked T9 compression deformity IMPRESSION: 1. Stable cardiomegaly with aortic atherosclerosis. 2. No active pulmonary disease. 3. Stable midthoracic kyphosis. Electronically Signed   By: Ashley Royalty M.D.   On: 08/07/2017 23:13    Procedures Procedures (including critical care time)  Medications Ordered in ED Medications  lisinopril (PRINIVIL,ZESTRIL) tablet 20 mg (not administered)  metoprolol tartrate (LOPRESSOR) tablet 12.5 mg (not administered)  aspirin chewable tablet 81 mg (not administered)  Vitamin D3 CHEW 1 tablet (not administered)  multivitamin tablet 1 tablet (not administered)     Initial Impression / Assessment and Plan / ED Course  I have reviewed the triage vital signs and the nursing  notes.  Pertinent labs & imaging results that were available during my care of the patient were reviewed by me and considered in my medical decision making (see chart for details).     Patient presents for evaluation of chest tightness/pain onset tonight associated with lightheadedness. No syncope.   EKG here shows NSR, per EMS showed atrial fibrillation on scene. History of the same. She reports her pain is improved after NTG but ongoing at a minimum.   Initial troponin is negative. Discussed with Dr. Leonette Monarch who has seen the patient. Will admit for overnight observation and serial troponins.  Discussed with Dr. Alcario Drought, [facility], who accepts the patient onto is service.   Final Clinical Impressions(s) / ED Diagnoses   Final diagnoses:  Nonspecific chest pain    ED Discharge Orders    None       Charlann Lange, PA-C 08/08/17 0131    Cardama, Grayce Sessions, MD  08/09/17 0008  

## 2017-08-08 NOTE — Plan of Care (Signed)
Pt denies anxiety. Displays no signs or symptoms of distress

## 2017-08-08 NOTE — Progress Notes (Signed)
PROGRESS NOTE    [name]  GEX:[id number] DOB: [birth year] DOA: 08/07/2017 PCP: Burnard Bunting, MD  Outpatient Specialists:     Brief Narrative: Patient is a 83 year old caucasian female with past documented medical history significant for rare bouts of A.Fib, not on anticoagulation, HTN, Cancer. Patient was admitted with chest pain. Elevated D dimer but negative CTA chest. For further cardiac work up. BP noted to be elevated on presentation  Assessment & Plan:   Principal Problem:   Chest pain, rule out acute myocardial infarction Active Problems:   HTN (hypertension)   PAF (paroxysmal atrial fibrillation) (HCC)   Murmur, cardiac   Continue to monitor closely Follow ECHO report Likely Cardiac stress test in am (but goal of care should be considered noting patient is 83 years old) Low threshold to consult Cardiology. Will need Cardiology follow up on discharge.  DVT prophylaxis: Lovenox Code Status: Full Family Communication: Husband Disposition Plan: Home eventually   Consultants:   None  Procedures:   None  Antimicrobials:   None   Subjective: No chest pain. No SOB. Poor historian.  Objective: Vitals:   08/08/17 0335 08/08/17 0431 08/08/17 0924 08/08/17 1300  BP: (!) 146/75 137/76 (!) 158/74 (!) 152/78  Pulse: (!) 59 70 73 61  Resp: 15 20  18   Temp:  97.8 F (36.6 C)  97.8 F (36.6 C)  TempSrc:  Oral  Oral  SpO2: 95% 97%  98%  Weight:        Intake/Output Summary (Last 24 hours) at 08/08/2017 1657 Last data filed at 08/08/2017 1202 Gross per 24 hour  Intake 480 ml  Output 1275 ml  Net -795 ml   Filed Weights   08/08/17 0209  Weight: 47.8 kg (105 lb 6.1 oz)    Examination:  General exam: Appears calm and comfortable  Respiratory system: Clear to auscultation. Respiratory effort normal. Cardiovascular system: S1 & S2. Gastrointestinal system: Abdomen is nondistended, soft and nontender. No organomegaly or masses felt. Normal bowel  sounds heard. Central nervous system: Alert and oriented. No focal neurological deficits. Extremities: Symmetric 5 x 5 power.  Data Reviewed: I have personally reviewed following labs and imaging studies  CBC: Recent Labs  Lab 08/07/17 2236  WBC 5.4  HGB 11.7*  HCT 34.3*  MCV 93.7  PLT 010   Basic Metabolic Panel: Recent Labs  Lab 08/07/17 2236  NA 130*  K 4.0  CL 98*  CO2 25  GLUCOSE 96  BUN 12  CREATININE 0.57  CALCIUM 8.4*   GFR: CrCl cannot be calculated (Unknown ideal weight.). Liver Function Tests: No results for input(s): AST, ALT, ALKPHOS, BILITOT, PROT, ALBUMIN in the last 168 hours. No results for input(s): LIPASE, AMYLASE in the last 168 hours. No results for input(s): AMMONIA in the last 168 hours. Coagulation Profile: No results for input(s): INR, PROTIME in the last 168 hours. Cardiac Enzymes: Recent Labs  Lab 08/08/17 0128 08/08/17 0435 08/08/17 0749  TROPONINI <0.03 <0.03 <0.03   BNP (last 3 results) No results for input(s): PROBNP in the last 8760 hours. HbA1C: No results for input(s): HGBA1C in the last 72 hours. CBG: No results for input(s): GLUCAP in the last 168 hours. Lipid Profile: No results for input(s): CHOL, HDL, LDLCALC, TRIG, CHOLHDL, LDLDIRECT in the last 72 hours. Thyroid Function Tests: Recent Labs    08/08/17 1240  TSH 1.041  FREET4 1.11   Anemia Panel: No results for input(s): VITAMINB12, FOLATE, FERRITIN, TIBC, IRON, RETICCTPCT in the last 72  hours. Urine analysis: No results found for: COLORURINE, APPEARANCEUR, LABSPEC, PHURINE, GLUCOSEU, HGBUR, BILIRUBINUR, KETONESUR, PROTEINUR, UROBILINOGEN, NITRITE, LEUKOCYTESUR Sepsis Labs: @LABRCNTIP (procalcitonin:4,lacticidven:4)  )No results found for this or any previous visit (from the past 240 hour(s)).       Radiology Studies: Dg Chest 2 View  Result Date: 08/07/2017 CLINICAL DATA:  Chest pain and dyspnea this evening. Atrial fibrillation. EXAM: CHEST  2 VIEW  COMPARISON:  07/26/2009 FINDINGS: Chronic stable cardiomegaly with aortic atherosclerosis. Mild central vascular congestion. No overt pulmonary edema or pulmonary consolidations. Slight blunting left costophrenic angle may be secondary to minimal effusion or pleural thickening. Stable thoracic kyphosis with chronic stable marked T9 compression deformity IMPRESSION: 1. Stable cardiomegaly with aortic atherosclerosis. 2. No active pulmonary disease. 3. Stable midthoracic kyphosis. Electronically Signed   By: Ashley Royalty M.D.   On: 08/07/2017 23:13   Ct Angio Chest Pe W Or Wo Contrast  Result Date: 08/08/2017 CLINICAL DATA:  Chest pressure EXAM: CT ANGIOGRAPHY CHEST WITH CONTRAST TECHNIQUE: Multidetector CT imaging of the chest was performed using the standard protocol during bolus administration of intravenous contrast. Multiplanar CT image reconstructions and MIPs were obtained to evaluate the vascular anatomy. CONTRAST:  111mL ISOVUE-370 IOPAMIDOL (ISOVUE-370) INJECTION 76% COMPARISON:  Chest x-ray performed today FINDINGS: Cardiovascular: No filling defects in the pulmonary artery is to suggest pulmonary emboli. Diffuse ectasia of the thoracic aorta. No aneurysm. Maximum diameter 3.2 cm in the proximal descending thoracic aorta. Heart is mildly enlarged. Moderate aortic calcifications. Mediastinum/Nodes: No mediastinal, hilar, or axillary adenopathy. Trachea and esophagus are unremarkable. Lungs/Pleura: Scarring in the lung bases and scarring or atelectasis in the lingula. No effusions. Upper Abdomen: Imaging into the upper abdomen shows no acute findings. Musculoskeletal: Chest wall soft tissues are unremarkable. Severe compression deformity in the scratched at severe compression deformity in a midthoracic vertebral body. Kyphosis. Review of the MIP images confirms the above findings. IMPRESSION: Cardiomegaly, aortic atherosclerosis. No evidence of pulmonary embolus. Scarring in the lung bases.  Scarring or  atelectasis in the lingula. Severe compression deformity in a midthoracic vertebral body. Electronically Signed   By: Rolm Baptise M.D.   On: 08/08/2017 16:06        Scheduled Meds: . aspirin  81 mg Oral Daily  . cholecalciferol  1,000 Units Oral Daily  . enoxaparin (LOVENOX) injection  40 mg Subcutaneous Q24H  . lisinopril  20 mg Oral Daily  . metoprolol tartrate  12.5 mg Oral Daily  . multivitamin with minerals  1 tablet Oral Daily   Continuous Infusions:   LOS: 0 days    Time spent: 71 Minutes    Dana Allan, MD  [facility] Pager #: [phone] 7PM-7AM contact night coverage as above

## 2017-08-08 NOTE — Plan of Care (Signed)
Pt admitted to 6E-19 in stable condition, denies pain, Very HOH has bilateral hearing aids placed in a denture cup with pt ID label and placed on bedside stand. Pt's husband with her and explained to both of them how to use call light and how to contact RN/NT via white phone using numbers on white board and both verbalized understanding.

## 2017-08-08 NOTE — H&P (Signed)
History and Physical    [name] MEQ:[id number] DOB: 30-Dec-1923 DOA: 08/07/2017  PCP: Burnard Bunting, MD  Patient coming from: [place]  I have personally briefly reviewed patient's old medical records in [place]  Chief Complaint: Chest pain  HPI: [name] is a 83 y.o. female with medical history significant of occasional bouts of A.Fib that she gets rarely, not on anticoagulation, HTN, 3 time cancer survivor.  Patient presents to the ED with c/o CP that onset 15 mins PTA.  Central, non-radiating CP.  EMS gave patient ASA, 2 SL NTG, and noted that patient was in A.Fib per report.  As apparently is typical for her, her A.Fib didn't last long and she spontaneously converted before getting to ED.   ED Course: In the ED, CP has resolved at this point,  BP remains elevated with systolics 297L-892J though.  Trop neg.    EKG showing NSR with a rate of 72, suspect for LVH.  No prior available for comparison (not in system but she apparently did see Dr. Martinique for cards some 10+ years ago).  Review of Systems: As per HPI otherwise 10 point review of systems negative.   Past Medical History:  Diagnosis Date  . Cancer ([place]) 1985   uterine  . Cancer ([place]) 1993   lung with right upper lobectomy  . Cancer ([place]) 1994   colon  . Grover's disease   . Osteoporosis   . Vertigo     Past Surgical History:  Procedure Laterality Date  . LOBECTOMY Right 1993  . REPLACEMENT TOTAL KNEE Left 2000     reports that she does not drink alcohol or use drugs. Her tobacco history is not on file.  Allergies  Allergen Reactions  . Celebrex [Celecoxib]   . Merthiolate [Thimerosal]   . Neomycin   . Penicillins     No family history on file.   Prior to Admission medications   Medication Sig Start Date End Date Taking? Authorizing Provider  aspirin 81 MG chewable tablet Chew 81 mg by mouth daily.   Yes [provider]  Cholecalciferol (VITAMIN D3) 1000  units CHEW Chew 1 tablet by mouth daily.   Yes [provider]  lisinopril (PRINIVIL,ZESTRIL) 20 MG tablet Take 20 mg by mouth daily. 06/01/17  Yes [provider]  metoprolol tartrate (LOPRESSOR) 25 MG tablet Take 12.5 mg by mouth daily. 07/20/17  Yes [provider]  Multiple Vitamin (MULTIVITAMIN) tablet Take 1 tablet by mouth daily. gummie   Yes [provider]    Physical Exam: Vitals:   08/07/17 2226 08/07/17 2230 08/07/17 2245 08/08/17 0030  BP: (!) 169/88 (!) 163/81 (!) 181/86 (!) 196/95  Pulse: 74 73 72 69  Resp: 19 17 (!) 22 20  Temp: 97.7 F (36.5 C)     TempSrc: Oral     SpO2: 98% 99% 99% 96%    Constitutional: NAD, calm, comfortable Eyes: PERRL, lids and conjunctivae normal ENMT: Mucous membranes are moist. Posterior pharynx clear of any exudate or lesions.Normal dentition.  Neck: normal, supple, no masses, no thyromegaly Respiratory: clear to auscultation bilaterally, no wheezing, no crackles. Normal respiratory effort. No accessory muscle use.  Cardiovascular: Regular rate and rhythm, has 4/6 SEM Abdomen: no tenderness, no masses palpated. No hepatosplenomegaly. Bowel sounds positive.  Musculoskeletal: no clubbing / cyanosis. No joint deformity upper and lower extremities. Good ROM, no contractures. Normal muscle tone.  Skin: no rashes, lesions, ulcers. No induration Neurologic: CN 2-12  grossly intact. Sensation intact, DTR normal. Strength 5/5 in all 4.  Psychiatric: Normal judgment and insight. Alert and oriented x 3. Normal mood.    Labs on Admission: I have personally reviewed following labs and imaging studies  CBC: Recent Labs  Lab 08/07/17 2236  WBC 5.4  HGB 11.7*  HCT 34.3*  MCV 93.7  PLT 379   Basic Metabolic Panel: Recent Labs  Lab 08/07/17 2236  NA 130*  K 4.0  CL 98*  CO2 25  GLUCOSE 96  BUN 12  CREATININE 0.57  CALCIUM 8.4*   GFR: CrCl cannot be calculated (Unknown ideal weight.). Liver Function  Tests: No results for input(s): AST, ALT, ALKPHOS, BILITOT, PROT, ALBUMIN in the last 168 hours. No results for input(s): LIPASE, AMYLASE in the last 168 hours. No results for input(s): AMMONIA in the last 168 hours. Coagulation Profile: No results for input(s): INR, PROTIME in the last 168 hours. Cardiac Enzymes: No results for input(s): CKTOTAL, CKMB, CKMBINDEX, TROPONINI in the last 168 hours. BNP (last 3 results) No results for input(s): PROBNP in the last 8760 hours. HbA1C: No results for input(s): HGBA1C in the last 72 hours. CBG: No results for input(s): GLUCAP in the last 168 hours. Lipid Profile: No results for input(s): CHOL, HDL, LDLCALC, TRIG, CHOLHDL, LDLDIRECT in the last 72 hours. Thyroid Function Tests: No results for input(s): TSH, T4TOTAL, FREET4, T3FREE, THYROIDAB in the last 72 hours. Anemia Panel: No results for input(s): VITAMINB12, FOLATE, FERRITIN, TIBC, IRON, RETICCTPCT in the last 72 hours. Urine analysis: No results found for: COLORURINE, APPEARANCEUR, LABSPEC, PHURINE, GLUCOSEU, HGBUR, BILIRUBINUR, KETONESUR, PROTEINUR, UROBILINOGEN, NITRITE, LEUKOCYTESUR  Radiological Exams on Admission: Dg Chest 2 View  Result Date: 08/07/2017 CLINICAL DATA:  Chest pain and dyspnea this evening. Atrial fibrillation. EXAM: CHEST  2 VIEW COMPARISON:  07/26/2009 FINDINGS: Chronic stable cardiomegaly with aortic atherosclerosis. Mild central vascular congestion. No overt pulmonary edema or pulmonary consolidations. Slight blunting left costophrenic angle may be secondary to minimal effusion or pleural thickening. Stable thoracic kyphosis with chronic stable marked T9 compression deformity IMPRESSION: 1. Stable cardiomegaly with aortic atherosclerosis. 2. No active pulmonary disease. 3. Stable midthoracic kyphosis. Electronically Signed   By: Ashley Royalty M.D.   On: 08/07/2017 23:13    EKG: Independently reviewed.  Assessment/Plan Principal Problem:   Chest pain, rule out  acute myocardial infarction Active Problems:   HTN (hypertension)   PAF (paroxysmal atrial fibrillation) (HCC)   Murmur, cardiac    1. CP r/o - 1. CP obs pathway 2. Serial trops 3. Tele monitor 4. Observe overnight 5. May warrant cards eval / follow up for other cardiac issues, but not keeping NPO for stress test right now. 6. CP possibly secondary to HTN urgency vs demand ischemia from PAF which she converted out? 2. PAF - 1. Apparently rare episodes 2. Continue home metoprolol 3. Not on anticoagulants despite history of this, presumably this was discussed with them in past and decision made not to have her on these for the time being, certainly husband doesn't sound like he wants them started, therefore I will hold off on starting these for now on this 83 yo patient. 4. May want to follow up with cards 3. HTN - possibly hypertensive urgency as cause of presentation 1. Continue home BP meds 2. Adding PRN labetalol for SBP > 180 4. Murmur - 1. Audible murmur on exam 2. 2d echo to further evaluate  DVT prophylaxis: Lovenox Code Status: Full Family Communication: Husband at bedside Disposition  Plan: Home after admit Consults called: None Admission status: Place in obs   Arlynn Mcdermid, [facility] Pager [phone]  If 7AM-7PM, please contact day team taking care of patient [email or web address] Password [email or web address]  08/08/2017, 1:20 AM

## 2017-08-09 ENCOUNTER — Observation Stay (HOSPITAL_BASED_OUTPATIENT_CLINIC_OR_DEPARTMENT_OTHER): Payer: PPO

## 2017-08-09 DIAGNOSIS — I480 Paroxysmal atrial fibrillation: Secondary | ICD-10-CM | POA: Diagnosis not present

## 2017-08-09 DIAGNOSIS — R011 Cardiac murmur, unspecified: Secondary | ICD-10-CM | POA: Diagnosis not present

## 2017-08-09 DIAGNOSIS — R079 Chest pain, unspecified: Secondary | ICD-10-CM

## 2017-08-09 DIAGNOSIS — I10 Essential (primary) hypertension: Secondary | ICD-10-CM | POA: Diagnosis not present

## 2017-08-09 DIAGNOSIS — I361 Nonrheumatic tricuspid (valve) insufficiency: Secondary | ICD-10-CM

## 2017-08-09 LAB — NM MYOCAR MULTI W/SPECT W/WALL MOTION / EF
Exercise duration (min): 4 min
Rest HR: 76 {beats}/min

## 2017-08-09 LAB — ECHOCARDIOGRAM COMPLETE: Weight: 1664.91 oz

## 2017-08-09 MED ORDER — TECHNETIUM TC 99M TETROFOSMIN IV KIT
10.0000 | PACK | Freq: Once | INTRAVENOUS | Status: AC | PRN
  Administered 2017-08-09: 10 via INTRAVENOUS

## 2017-08-09 MED ORDER — REGADENOSON 0.4 MG/5ML IV SOLN
INTRAVENOUS | Status: AC
  Administered 2017-08-09: 0.4 mg via INTRAVENOUS
  Filled 2017-08-09: qty 5

## 2017-08-09 MED ORDER — TECHNETIUM TC 99M TETROFOSMIN IV KIT
30.0000 | PACK | Freq: Once | INTRAVENOUS | Status: AC | PRN
  Administered 2017-08-09: 30 via INTRAVENOUS

## 2017-08-09 MED ORDER — REGADENOSON 0.4 MG/5ML IV SOLN
0.4000 mg | Freq: Once | INTRAVENOUS | Status: AC
  Administered 2017-08-09: 0.4 mg via INTRAVENOUS
  Filled 2017-08-09: qty 5

## 2017-08-09 NOTE — Progress Notes (Signed)
Discharge instructions, RX's and follow up appt explained and provided to the patient and family, verbalized understanding. Patient left floor via wheelchair accompanied by staff no c/o pain or shortness of breath at d/c.  Mariangela Heldt, Tivis Ringer, RN

## 2017-08-09 NOTE — Progress Notes (Signed)
This note also relates to the following rows which could not be included: Pulse Rate - Cannot attach notes to unvalidated device data ECG Heart Rate - Cannot attach notes to unvalidated device data Resp - Cannot attach notes to unvalidated device data SpO2 - Cannot attach notes to unvalidated device data  Pre LexiScan VS

## 2017-08-09 NOTE — Discharge Summary (Signed)
Discharge Summary  [name] TDD:[id number] DOB: 1924-03-30  PCP: Burnard Bunting, MD  Admit date: 08/07/2017 Discharge date: 08/09/2017  Time spent: <30 mins  Recommendations for Outpatient Follow-up:  1. PCP  Discharge Diagnoses:  Active Hospital Problems   Diagnosis Date Noted  . Chest pain, rule out acute myocardial infarction 08/08/2017  . HTN (hypertension) 08/08/2017  . PAF (paroxysmal atrial fibrillation) ([place]) 08/08/2017  . Murmur, cardiac 08/08/2017    Resolved Hospital Problems  No resolved problems to display.    Discharge Condition: Stable  Diet recommendation: Heart healthy  Vitals:   08/09/17 0930 08/09/17 1250  BP: 126/66 134/71  Pulse: 91 73  Resp: (!) 25 15  Temp:  98.3 F (36.8 C)  SpO2: 96% 97%    History of present illness:  [name] is a 83 y.o. female with medical history significant of occasional bouts of A.Fib that she gets rarely, not on anticoagulation, HTN, 3 time cancer survivor. Patient presents to the ED with c/o CP, onset about 15 mins PTA. Pressure like, central, non-radiating CP.  EMS gave patient ASA, 2 SL NTG, and noted that patient was in A.Fib per report. Typical for her, her A.Fib didn't last long and she spontaneously converted to NSR before getting to ED. In the ED, CP has resolved. BP noted to be elevated with systolics [id number] likely due to anxiety. Trop neg, EKG showing NSR with a rate of 72. Pt admitted further management. Denied any other associated symptoms.  Today, pt reported no further chest pain/pressure since admission. Denied any worsening SOB, abdominal pain, N/V/D/C, fever/chills, dizziness. Pt stable to be discharged home with follow up with PCP.  Hospital Course:  Principal Problem:   Chest pain, rule out acute myocardial infarction Active Problems:   HTN (hypertension)   PAF (paroxysmal atrial fibrillation) (H[facility])   Murmur, cardiac  #Likely non-cardiac chest pain Resolved Trop x3 neg, EKG  showed no acute ischemic changes ECHO showed: EF 65-70%, no RWMA, grade 1DD. Severly calcified aortic leaflets with mild stenosis and trivial regurgitation NM stress test: Reduced septal and lateral wall activity matched on stress and rest images, scarring is not excluded but there is no significant degree of inducible ischemia identified.  Non invasive risk stratification*: Low D-dimer elevated, CT angio negative for PE  #P. Afib EKG shows NSR Continue home metoprolol Not on anticoagulant, this was discussed with them in past with their primary care and decision made not to be on [facility] PCP to follow up closely  #HTN Continue home BP med   Procedures:  NM stress test on 08/09/17  Consultations:  None  Discharge Exam: BP 134/71 (BP Location: Left Arm)   Pulse 73   Temp 98.3 F (36.8 C) (Oral)   Resp 15   Wt 47.2 kg (104 lb 0.9 oz)   SpO2 97%   General: AAO X 3, not in any distress, HOH Cardiovascular: S1-S2 present, 4/6 SEM  Respiratory: Chest clear bilaterally   Discharge Instructions You were cared for by a hospitalist during your hospital stay. If you have any questions about your discharge medications or the care you received while you were in the hospital after you are discharged, you can call the unit and asked to speak with the hospitalist on call if the hospitalist that took care of you is not available. Once you are discharged, your primary care physician will handle any further medical issues. Please note that NO REFILLS for any discharge medications will be authorized once you  are discharged, as it is imperative that you return to your primary care physician (or establish a relationship with a primary care physician if you do not have one) for your aftercare needs so that they can reassess your need for medications and monitor your lab values.  Discharge Instructions    Diet - low sodium heart healthy   Complete by:  As directed    Increase activity slowly   Complete  by:  As directed      Allergies as of 08/09/2017      Reactions   Celebrex [celecoxib]    Merthiolate [thimerosal]    Neomycin    Penicillins       Medication List    TAKE these medications   aspirin 81 MG chewable tablet Chew 81 mg by mouth daily.   lisinopril 20 MG tablet Commonly known as:  PRINIVIL,ZESTRIL Take 20 mg by mouth daily.   metoprolol tartrate 25 MG tablet Commonly known as:  LOPRESSOR Take 12.5 mg by mouth daily.   multivitamin tablet Take 1 tablet by mouth daily. gummie   Vitamin D3 1000 units Chew Chew 1 tablet by mouth daily.      Allergies  Allergen Reactions  . Celebrex [Celecoxib]   . Merthiolate [Thimerosal]   . Neomycin   . Penicillins    Follow-up Information    Burnard Bunting, MD. Schedule an appointment as soon as possible for a visit in 1 week(s).   Specialty:  Internal Medicine Contact information: [place] [address] [phone]            The results of significant diagnostics from this hospitalization (including imaging, microbiology, ancillary and laboratory) are listed below for reference.    Significant Diagnostic Studies: Dg Chest 2 View  Result Date: 08/07/2017 CLINICAL DATA:  Chest pain and dyspnea this evening. Atrial fibrillation. EXAM: CHEST  2 VIEW COMPARISON:  07/26/2009 FINDINGS: Chronic stable cardiomegaly with aortic atherosclerosis. Mild central vascular congestion. No overt pulmonary edema or pulmonary consolidations. Slight blunting left costophrenic angle may be secondary to minimal effusion or pleural thickening. Stable thoracic kyphosis with chronic stable marked T9 compression deformity IMPRESSION: 1. Stable cardiomegaly with aortic atherosclerosis. 2. No active pulmonary disease. 3. Stable midthoracic kyphosis. Electronically Signed   By: Ashley Royalty M.D.   On: 08/07/2017 23:13   Ct Angio Chest Pe W Or Wo Contrast  Result Date: 08/08/2017 CLINICAL DATA:  Chest pressure EXAM:  CT ANGIOGRAPHY CHEST WITH CONTRAST TECHNIQUE: Multidetector CT imaging of the chest was performed using the standard protocol during bolus administration of intravenous contrast. Multiplanar CT image reconstructions and MIPs were obtained to evaluate the vascular anatomy. CONTRAST:  142mL ISOVUE-370 IOPAMIDOL (ISOVUE-370) INJECTION 76% COMPARISON:  Chest x-ray performed today FINDINGS: Cardiovascular: No filling defects in the pulmonary artery is to suggest pulmonary emboli. Diffuse ectasia of the thoracic aorta. No aneurysm. Maximum diameter 3.2 cm in the proximal descending thoracic aorta. Heart is mildly enlarged. Moderate aortic calcifications. Mediastinum/Nodes: No mediastinal, hilar, or axillary adenopathy. Trachea and esophagus are unremarkable. Lungs/Pleura: Scarring in the lung bases and scarring or atelectasis in the lingula. No effusions. Upper Abdomen: Imaging into the upper abdomen shows no acute findings. Musculoskeletal: Chest wall soft tissues are unremarkable. Severe compression deformity in the scratched at severe compression deformity in a midthoracic vertebral body. Kyphosis. Review of the MIP images confirms the above findings. IMPRESSION: Cardiomegaly, aortic atherosclerosis. No evidence of pulmonary embolus. Scarring in the lung bases.  Scarring or atelectasis in the lingula.  Severe compression deformity in a midthoracic vertebral body. Electronically Signed   By: Rolm Baptise M.D.   On: 08/08/2017 16:06   Nm Myocar Multi W/spect W/wall Motion / Ef  Result Date: 08/09/2017 CLINICAL DATA:  Chest pain. Atrial fibrillation. Hypertension. Cardiac murmur. EXAM: MYOCARDIAL IMAGING WITH SPECT (REST AND PHARMACOLOGIC-STRESS) GATED LEFT VENTRICULAR WALL MOTION STUDY LEFT VENTRICULAR EJECTION FRACTION TECHNIQUE: Standard myocardial SPECT imaging was performed after resting intravenous injection of 10 mCi Tc-31m tetrofosmin. Subsequently, intravenous infusion of Lexiscan was performed under the  supervision of the Cardiology staff. At peak effect of the drug, 30 mCi Tc-41m tetrofosmin was injected intravenously and standard myocardial SPECT imaging was performed. Quantitative gated imaging was also performed to evaluate left ventricular wall motion, and estimate left ventricular ejection fraction. COMPARISON:  Chest CT dated 08/08/2017 FINDINGS: Perfusion: There is reduced septal and lateral while activity matched on stress and rest images, overall defect size moderate and severity mild-to-moderate. No inducible ischemia identified. Automated windowing causes the stress images to appear much lower in activity compared to the rest images in general, but when split layout and manual windowing is performed, the images appear much more commensurate. There is also some motion artifact causing blurring and accentuation of the apex on the rest images. Wall Motion: Normal left ventricular wall motion. No left ventricular dilation. Left Ventricular Ejection Fraction: 72 % End diastolic volume 74 ml End systolic volume 20 ml IMPRESSION: 1. Reduced septal and lateral wall activity matched on stress and rest images, scarring is not excluded but there is no significant degree of inducible ischemia identified. 2. Normal left ventricular wall motion. 3. Left ventricular ejection fraction 72% 4. Non invasive risk stratification*: Low *2012 Appropriate Use Criteria for Coronary Revascularization Focused Update: J Am Coll Cardiol. 0762;26(3):335-456. http://content.airportbarriers.com.aspx?articleid=[email or web address] Electronically Signed   By: Van Clines M.D.   On: 08/09/2017 14:32    Microbiology: No results found for this or any previous visit (from the past 240 hour(s)).   Labs: Basic Metabolic Panel: Recent Labs  Lab 08/07/17 2236  NA 130*  K 4.0  CL 98*  CO2 25  GLUCOSE 96  BUN 12  CREATININE 0.57  CALCIUM 8.4*   Liver Function Tests: No results for input(s): AST, ALT, ALKPHOS, BILITOT, PROT,  ALBUMIN in the last 168 hours. No results for input(s): LIPASE, AMYLASE in the last 168 hours. No results for input(s): AMMONIA in the last 168 hours. CBC: Recent Labs  Lab 08/07/17 2236  WBC 5.4  HGB 11.7*  HCT 34.3*  MCV 93.7  PLT 178   Cardiac Enzymes: Recent Labs  Lab 08/08/17 0128 08/08/17 0435 08/08/17 0749  TROPONINI <0.03 <0.03 <0.03   BNP: BNP (last 3 results) No results for input(s): BNP in the last 8760 hours.  ProBNP (last 3 results) No results for input(s): PROBNP in the last 8760 hours.  CBG: No results for input(s): GLUCAP in the last 168 hours.     Signed:  Alma Friendly, MD [facility] 08/09/2017, 6:07 PM

## 2017-08-09 NOTE — Progress Notes (Signed)
  Echocardiogram 2D Echocardiogram has been performed.  [name] F 08/09/2017, 11:30 AM

## 2017-08-09 NOTE — Progress Notes (Signed)
This note also relates to the following rows which could not be included: Pulse Rate - Cannot attach notes to unvalidated device data ECG Heart Rate - Cannot attach notes to unvalidated device data Resp - Cannot attach notes to unvalidated device data SpO2 - Cannot attach notes to unvalidated device data  1 min, Luke PA at bedside. Pt reports some SOB

## 2017-08-09 NOTE — Clinical Social Work Note (Signed)
Clinical Social Work Assessment  Patient Details  Name: [name] MRN: [id number] Date of Birth: Jan 13, 1924  Date of referral:  08/09/17               Reason for consult:  Facility Placement(from [place])                Permission sought to share information with:  Facility Sport and exercise psychologist, Family Supports Permission granted to share information::     Name::     Clancy Gourd  Agency::  [facility]  Relationship::  spouse  Contact Information:  [phone]  Housing/Transportation Living arrangements for the past 2 months:  [place] Information:  Spouse Patient Interpreter Needed:  None Criminal Activity/Legal Involvement Pertinent to Current Situation/Hospitalization:  No - Comment as needed Significant Relationships:  Adult Children, Spouse Lives with:  Spouse Do you feel safe going back to the place where you live?  Yes Need for family participation in patient care:  Yes (Comment)  Care giving concerns: Patient from [place] for admitted from facility.  Social Worker assessment / plan: CSW met with patient's daughter and spouse in hallway while patient received patient care from nursing staff. Spouse reported he and patient live in [facility]F at [facility]. Family hopeful patient can return home today. CSW referred to [facility] for any home health needs. CSW signing off, as patient from [place] and no indication for higher level of care at this time. Please re-consult if disposition plan changes.  Employment status:  Retired Archivist) PT Recommendations:  Not assessed at this time Information / Referral to community resources:  Other (Comment Required)(back to ILF)  Patient/Family's Response to care: Family eager to meet with MD and hopeful MD will round soon.  Patient/Family's Understanding of and Emotional Response to Diagnosis, Current Treatment, and Prognosis:  Family with good understanding of patient's condition and hopeful patient can return home today.  Emotional Assessment Appearance:  Appears stated age Attitude/Demeanor/Rapport:  Unable to Assess Affect (typically observed):  Unable to Assess Orientation:  Oriented to Self, Oriented to Situation, Oriented to Place, Oriented to  Time Alcohol / Substance use:  Not Applicable Psych involvement (Current and /or in the community):  No (Comment)  Discharge Needs  Concerns to be addressed:  Discharge Planning Concerns Readmission within the last 30 days:  No Current discharge risk:  None Barriers to Discharge:  Continued Medical Work up   Estanislado Emms, LCSW 08/09/2017, 12:29 PM

## 2017-08-09 NOTE — Progress Notes (Signed)
Lexiscan performed this am. Pt tolerated test well, final report pending.  Kerin Ransom PA-C 08/09/2017 9:45 AM

## 2017-08-09 NOTE — Progress Notes (Signed)
This note also relates to the following rows which could not be included: Pulse Rate - Cannot attach notes to unvalidated device data ECG Heart Rate - Cannot attach notes to unvalidated device data Resp - Cannot attach notes to unvalidated device data SpO2 - Cannot attach notes to unvalidated device data  3 mins, pt denies CP/SOB, Lurena Joiner PA at bedside

## 2017-08-09 NOTE — Progress Notes (Signed)
This note also relates to the following rows which could not be included: Pulse Rate - Cannot attach notes to unvalidated device data ECG Heart Rate - Cannot attach notes to unvalidated device data Resp - Cannot attach notes to unvalidated device data SpO2 - Cannot attach notes to unvalidated device data  5 mins, pt deines CP/SOB, Luke PA at bedside. Test ended

## 2017-08-10 ENCOUNTER — Encounter: Payer: Self-pay | Admitting: Gastroenterology

## 2017-08-12 DIAGNOSIS — I10 Essential (primary) hypertension: Secondary | ICD-10-CM | POA: Diagnosis not present

## 2017-08-12 DIAGNOSIS — I4891 Unspecified atrial fibrillation: Secondary | ICD-10-CM | POA: Diagnosis not present

## 2017-08-12 DIAGNOSIS — R531 Weakness: Secondary | ICD-10-CM | POA: Diagnosis not present

## 2017-09-07 DIAGNOSIS — I4891 Unspecified atrial fibrillation: Secondary | ICD-10-CM | POA: Diagnosis not present

## 2017-09-07 DIAGNOSIS — R0789 Other chest pain: Secondary | ICD-10-CM | POA: Diagnosis not present

## 2017-09-07 DIAGNOSIS — I10 Essential (primary) hypertension: Secondary | ICD-10-CM | POA: Diagnosis not present

## 2017-09-07 DIAGNOSIS — R531 Weakness: Secondary | ICD-10-CM | POA: Diagnosis not present

## 2017-09-07 DIAGNOSIS — Z6820 Body mass index (BMI) 20.0-20.9, adult: Secondary | ICD-10-CM | POA: Diagnosis not present

## 2018-01-19 DIAGNOSIS — I4891 Unspecified atrial fibrillation: Secondary | ICD-10-CM | POA: Diagnosis not present

## 2018-01-19 DIAGNOSIS — R82998 Other abnormal findings in urine: Secondary | ICD-10-CM | POA: Diagnosis not present

## 2018-01-19 DIAGNOSIS — M810 Age-related osteoporosis without current pathological fracture: Secondary | ICD-10-CM | POA: Diagnosis not present

## 2018-01-19 DIAGNOSIS — I10 Essential (primary) hypertension: Secondary | ICD-10-CM | POA: Diagnosis not present

## 2018-01-26 DIAGNOSIS — R413 Other amnesia: Secondary | ICD-10-CM | POA: Diagnosis not present

## 2018-01-26 DIAGNOSIS — I4891 Unspecified atrial fibrillation: Secondary | ICD-10-CM | POA: Diagnosis not present

## 2018-01-26 DIAGNOSIS — S22009S Unspecified fracture of unspecified thoracic vertebra, sequela: Secondary | ICD-10-CM | POA: Diagnosis not present

## 2018-01-26 DIAGNOSIS — Z6820 Body mass index (BMI) 20.0-20.9, adult: Secondary | ICD-10-CM | POA: Diagnosis not present

## 2018-01-26 DIAGNOSIS — Z1389 Encounter for screening for other disorder: Secondary | ICD-10-CM | POA: Diagnosis not present

## 2018-01-26 DIAGNOSIS — D692 Other nonthrombocytopenic purpura: Secondary | ICD-10-CM | POA: Diagnosis not present

## 2018-01-26 DIAGNOSIS — Z0000 Encounter for general adult medical examination without abnormal findings: Secondary | ICD-10-CM | POA: Diagnosis not present

## 2018-01-26 DIAGNOSIS — C3490 Malignant neoplasm of unspecified part of unspecified bronchus or lung: Secondary | ICD-10-CM | POA: Diagnosis not present

## 2018-01-26 DIAGNOSIS — C55 Malignant neoplasm of uterus, part unspecified: Secondary | ICD-10-CM | POA: Diagnosis not present

## 2018-01-26 DIAGNOSIS — M810 Age-related osteoporosis without current pathological fracture: Secondary | ICD-10-CM | POA: Diagnosis not present

## 2018-01-26 DIAGNOSIS — I10 Essential (primary) hypertension: Secondary | ICD-10-CM | POA: Diagnosis not present

## 2018-01-26 DIAGNOSIS — C189 Malignant neoplasm of colon, unspecified: Secondary | ICD-10-CM | POA: Diagnosis not present

## 2018-02-03 DIAGNOSIS — Z1212 Encounter for screening for malignant neoplasm of rectum: Secondary | ICD-10-CM | POA: Diagnosis not present

## 2018-03-16 DIAGNOSIS — D0471 Carcinoma in situ of skin of right lower limb, including hip: Secondary | ICD-10-CM | POA: Diagnosis not present

## 2018-03-16 DIAGNOSIS — D485 Neoplasm of uncertain behavior of skin: Secondary | ICD-10-CM | POA: Diagnosis not present

## 2018-03-16 DIAGNOSIS — Z85828 Personal history of other malignant neoplasm of skin: Secondary | ICD-10-CM | POA: Diagnosis not present

## 2018-03-16 DIAGNOSIS — D0472 Carcinoma in situ of skin of left lower limb, including hip: Secondary | ICD-10-CM | POA: Diagnosis not present

## 2018-03-23 DIAGNOSIS — D0472 Carcinoma in situ of skin of left lower limb, including hip: Secondary | ICD-10-CM | POA: Diagnosis not present

## 2018-05-27 DIAGNOSIS — C44729 Squamous cell carcinoma of skin of left lower limb, including hip: Secondary | ICD-10-CM | POA: Diagnosis not present

## 2018-05-27 DIAGNOSIS — L0889 Other specified local infections of the skin and subcutaneous tissue: Secondary | ICD-10-CM | POA: Diagnosis not present

## 2018-06-15 DIAGNOSIS — H6123 Impacted cerumen, bilateral: Secondary | ICD-10-CM | POA: Diagnosis not present

## 2018-07-20 DIAGNOSIS — I4891 Unspecified atrial fibrillation: Secondary | ICD-10-CM | POA: Diagnosis not present

## 2018-07-20 DIAGNOSIS — C55 Malignant neoplasm of uterus, part unspecified: Secondary | ICD-10-CM | POA: Diagnosis not present

## 2018-07-20 DIAGNOSIS — C3490 Malignant neoplasm of unspecified part of unspecified bronchus or lung: Secondary | ICD-10-CM | POA: Diagnosis not present

## 2018-07-20 DIAGNOSIS — I10 Essential (primary) hypertension: Secondary | ICD-10-CM | POA: Diagnosis not present

## 2018-07-20 DIAGNOSIS — Z8709 Personal history of other diseases of the respiratory system: Secondary | ICD-10-CM | POA: Diagnosis not present

## 2018-07-20 DIAGNOSIS — M810 Age-related osteoporosis without current pathological fracture: Secondary | ICD-10-CM | POA: Diagnosis not present

## 2018-07-20 DIAGNOSIS — D692 Other nonthrombocytopenic purpura: Secondary | ICD-10-CM | POA: Diagnosis not present

## 2018-07-20 DIAGNOSIS — Z6820 Body mass index (BMI) 20.0-20.9, adult: Secondary | ICD-10-CM | POA: Diagnosis not present

## 2018-07-20 DIAGNOSIS — R413 Other amnesia: Secondary | ICD-10-CM | POA: Diagnosis not present

## 2018-07-20 DIAGNOSIS — C189 Malignant neoplasm of colon, unspecified: Secondary | ICD-10-CM | POA: Diagnosis not present

## 2018-09-09 DIAGNOSIS — M79671 Pain in right foot: Secondary | ICD-10-CM | POA: Diagnosis not present

## 2018-09-09 DIAGNOSIS — B351 Tinea unguium: Secondary | ICD-10-CM | POA: Diagnosis not present

## 2018-09-09 DIAGNOSIS — M79672 Pain in left foot: Secondary | ICD-10-CM | POA: Diagnosis not present

## 2018-10-27 ENCOUNTER — Other Ambulatory Visit (HOSPITAL_COMMUNITY): Payer: Self-pay | Admitting: Internal Medicine

## 2018-10-27 ENCOUNTER — Ambulatory Visit (HOSPITAL_COMMUNITY)
Admission: RE | Admit: 2018-10-27 | Discharge: 2018-10-27 | Disposition: A | Discharge status: Home / Self Care | Payer: PPO | Source: Ambulatory Visit | Attending: Family | Admitting: Family

## 2018-10-27 DIAGNOSIS — M7989 Other specified soft tissue disorders: Secondary | ICD-10-CM | POA: Insufficient documentation

## 2018-10-27 DIAGNOSIS — R58 Hemorrhage, not elsewhere classified: Secondary | ICD-10-CM | POA: Diagnosis not present

## 2018-10-27 DIAGNOSIS — Z6820 Body mass index (BMI) 20.0-20.9, adult: Secondary | ICD-10-CM | POA: Diagnosis not present

## 2018-12-07 DIAGNOSIS — K047 Periapical abscess without sinus: Secondary | ICD-10-CM | POA: Diagnosis not present

## 2019-01-25 DIAGNOSIS — R7989 Other specified abnormal findings of blood chemistry: Secondary | ICD-10-CM | POA: Diagnosis not present

## 2019-01-25 DIAGNOSIS — M810 Age-related osteoporosis without current pathological fracture: Secondary | ICD-10-CM | POA: Diagnosis not present

## 2019-01-25 DIAGNOSIS — I10 Essential (primary) hypertension: Secondary | ICD-10-CM | POA: Diagnosis not present

## 2019-01-26 DIAGNOSIS — R82998 Other abnormal findings in urine: Secondary | ICD-10-CM | POA: Diagnosis not present

## 2019-01-26 DIAGNOSIS — I10 Essential (primary) hypertension: Secondary | ICD-10-CM | POA: Diagnosis not present

## 2019-02-01 DIAGNOSIS — I10 Essential (primary) hypertension: Secondary | ICD-10-CM | POA: Diagnosis not present

## 2019-02-01 DIAGNOSIS — M810 Age-related osteoporosis without current pathological fracture: Secondary | ICD-10-CM | POA: Diagnosis not present

## 2019-02-01 DIAGNOSIS — R413 Other amnesia: Secondary | ICD-10-CM | POA: Diagnosis not present

## 2019-02-01 DIAGNOSIS — C3490 Malignant neoplasm of unspecified part of unspecified bronchus or lung: Secondary | ICD-10-CM | POA: Diagnosis not present

## 2019-02-01 DIAGNOSIS — Z1339 Encounter for screening examination for other mental health and behavioral disorders: Secondary | ICD-10-CM | POA: Diagnosis not present

## 2019-02-01 DIAGNOSIS — C189 Malignant neoplasm of colon, unspecified: Secondary | ICD-10-CM | POA: Diagnosis not present

## 2019-02-01 DIAGNOSIS — Z1331 Encounter for screening for depression: Secondary | ICD-10-CM | POA: Diagnosis not present

## 2019-02-01 DIAGNOSIS — C55 Malignant neoplasm of uterus, part unspecified: Secondary | ICD-10-CM | POA: Diagnosis not present

## 2019-02-01 DIAGNOSIS — Z0000 Encounter for general adult medical examination without abnormal findings: Secondary | ICD-10-CM | POA: Diagnosis not present

## 2019-02-01 DIAGNOSIS — I4891 Unspecified atrial fibrillation: Secondary | ICD-10-CM | POA: Diagnosis not present

## 2019-02-01 DIAGNOSIS — Z8709 Personal history of other diseases of the respiratory system: Secondary | ICD-10-CM | POA: Diagnosis not present

## 2019-02-01 DIAGNOSIS — D692 Other nonthrombocytopenic purpura: Secondary | ICD-10-CM | POA: Diagnosis not present

## 2019-07-26 DIAGNOSIS — C55 Malignant neoplasm of uterus, part unspecified: Secondary | ICD-10-CM | POA: Diagnosis not present

## 2019-07-26 DIAGNOSIS — C189 Malignant neoplasm of colon, unspecified: Secondary | ICD-10-CM | POA: Diagnosis not present

## 2019-07-26 DIAGNOSIS — C3490 Malignant neoplasm of unspecified part of unspecified bronchus or lung: Secondary | ICD-10-CM | POA: Diagnosis not present

## 2019-07-26 DIAGNOSIS — Z1331 Encounter for screening for depression: Secondary | ICD-10-CM | POA: Diagnosis not present

## 2019-07-26 DIAGNOSIS — I10 Essential (primary) hypertension: Secondary | ICD-10-CM | POA: Diagnosis not present

## 2019-07-26 DIAGNOSIS — R413 Other amnesia: Secondary | ICD-10-CM | POA: Diagnosis not present

## 2019-07-26 DIAGNOSIS — I4891 Unspecified atrial fibrillation: Secondary | ICD-10-CM | POA: Diagnosis not present

## 2019-12-29 DIAGNOSIS — B351 Tinea unguium: Secondary | ICD-10-CM | POA: Diagnosis not present

## 2019-12-29 DIAGNOSIS — M79671 Pain in right foot: Secondary | ICD-10-CM | POA: Diagnosis not present

## 2019-12-29 DIAGNOSIS — M79672 Pain in left foot: Secondary | ICD-10-CM | POA: Diagnosis not present

## 2020-02-09 DIAGNOSIS — C3490 Malignant neoplasm of unspecified part of unspecified bronchus or lung: Secondary | ICD-10-CM | POA: Diagnosis not present

## 2020-02-09 DIAGNOSIS — C55 Malignant neoplasm of uterus, part unspecified: Secondary | ICD-10-CM | POA: Diagnosis not present

## 2020-02-09 DIAGNOSIS — D692 Other nonthrombocytopenic purpura: Secondary | ICD-10-CM | POA: Diagnosis not present

## 2020-02-09 DIAGNOSIS — Z0000 Encounter for general adult medical examination without abnormal findings: Secondary | ICD-10-CM | POA: Diagnosis not present

## 2020-02-09 DIAGNOSIS — M810 Age-related osteoporosis without current pathological fracture: Secondary | ICD-10-CM | POA: Diagnosis not present

## 2020-02-09 DIAGNOSIS — C189 Malignant neoplasm of colon, unspecified: Secondary | ICD-10-CM | POA: Diagnosis not present

## 2020-02-09 DIAGNOSIS — I10 Essential (primary) hypertension: Secondary | ICD-10-CM | POA: Diagnosis not present

## 2020-02-09 DIAGNOSIS — I4891 Unspecified atrial fibrillation: Secondary | ICD-10-CM | POA: Diagnosis not present

## 2020-02-09 DIAGNOSIS — R413 Other amnesia: Secondary | ICD-10-CM | POA: Diagnosis not present

## 2020-08-28 DIAGNOSIS — M419 Scoliosis, unspecified: Secondary | ICD-10-CM | POA: Diagnosis not present

## 2020-08-28 DIAGNOSIS — M810 Age-related osteoporosis without current pathological fracture: Secondary | ICD-10-CM | POA: Diagnosis not present

## 2020-08-28 DIAGNOSIS — I10 Essential (primary) hypertension: Secondary | ICD-10-CM | POA: Diagnosis not present

## 2020-08-28 DIAGNOSIS — R413 Other amnesia: Secondary | ICD-10-CM | POA: Diagnosis not present

## 2020-08-28 DIAGNOSIS — D692 Other nonthrombocytopenic purpura: Secondary | ICD-10-CM | POA: Diagnosis not present

## 2020-12-03 ENCOUNTER — Other Ambulatory Visit: Payer: Self-pay

## 2020-12-03 ENCOUNTER — Emergency Department (HOSPITAL_COMMUNITY): Payer: PPO

## 2020-12-03 ENCOUNTER — Emergency Department (HOSPITAL_COMMUNITY)
Admission: EM | Admit: 2020-12-03 | Discharge: 2020-12-03 | Disposition: A | Discharge status: Home / Self Care | Payer: PPO | Attending: Emergency Medicine | Admitting: Emergency Medicine

## 2020-12-03 DIAGNOSIS — S60222A Contusion of left hand, initial encounter: Secondary | ICD-10-CM | POA: Diagnosis not present

## 2020-12-03 DIAGNOSIS — I10 Essential (primary) hypertension: Secondary | ICD-10-CM | POA: Insufficient documentation

## 2020-12-03 DIAGNOSIS — S0083XA Contusion of other part of head, initial encounter: Secondary | ICD-10-CM

## 2020-12-03 DIAGNOSIS — Z8542 Personal history of malignant neoplasm of other parts of uterus: Secondary | ICD-10-CM | POA: Diagnosis not present

## 2020-12-03 DIAGNOSIS — Y9301 Activity, walking, marching and hiking: Secondary | ICD-10-CM | POA: Insufficient documentation

## 2020-12-03 DIAGNOSIS — S0990XA Unspecified injury of head, initial encounter: Secondary | ICD-10-CM | POA: Diagnosis not present

## 2020-12-03 DIAGNOSIS — Z85038 Personal history of other malignant neoplasm of large intestine: Secondary | ICD-10-CM | POA: Diagnosis not present

## 2020-12-03 DIAGNOSIS — W01198A Fall on same level from slipping, tripping and stumbling with subsequent striking against other object, initial encounter: Secondary | ICD-10-CM | POA: Diagnosis not present

## 2020-12-03 DIAGNOSIS — R609 Edema, unspecified: Secondary | ICD-10-CM | POA: Diagnosis not present

## 2020-12-03 DIAGNOSIS — W19XXXA Unspecified fall, initial encounter: Secondary | ICD-10-CM

## 2020-12-03 DIAGNOSIS — Z7982 Long term (current) use of aspirin: Secondary | ICD-10-CM | POA: Diagnosis not present

## 2020-12-03 DIAGNOSIS — M7989 Other specified soft tissue disorders: Secondary | ICD-10-CM | POA: Diagnosis not present

## 2020-12-03 DIAGNOSIS — S60212A Contusion of left wrist, initial encounter: Secondary | ICD-10-CM | POA: Insufficient documentation

## 2020-12-03 DIAGNOSIS — M19032 Primary osteoarthritis, left wrist: Secondary | ICD-10-CM | POA: Diagnosis not present

## 2020-12-03 DIAGNOSIS — Z85118 Personal history of other malignant neoplasm of bronchus and lung: Secondary | ICD-10-CM | POA: Insufficient documentation

## 2020-12-03 DIAGNOSIS — Z96659 Presence of unspecified artificial knee joint: Secondary | ICD-10-CM | POA: Insufficient documentation

## 2020-12-03 DIAGNOSIS — Z043 Encounter for examination and observation following other accident: Secondary | ICD-10-CM | POA: Diagnosis not present

## 2020-12-03 DIAGNOSIS — R0902 Hypoxemia: Secondary | ICD-10-CM | POA: Diagnosis not present

## 2020-12-03 DIAGNOSIS — Z79899 Other long term (current) drug therapy: Secondary | ICD-10-CM | POA: Diagnosis not present

## 2020-12-03 DIAGNOSIS — S0993XA Unspecified injury of face, initial encounter: Secondary | ICD-10-CM | POA: Diagnosis not present

## 2020-12-03 MED ORDER — ACETAMINOPHEN 500 MG PO TABS
1000.0000 mg | ORAL_TABLET | Freq: Once | ORAL | Status: AC
  Administered 2020-12-03: 1000 mg via ORAL
  Filled 2020-12-03: qty 2

## 2020-12-03 NOTE — ED Provider Notes (Signed)
[name] Provider Note   CSN: [id number] Arrival date & time: 12/03/20  5009     History Chief Complaint  Patient presents with  . Fall    [address] [name] is a 87 y.o. female.  [facility]  She lives home with her husband.  She was walking to the bathroom and lost her balance and fell.  No apparent loss of consciousness.  No mental status change.  Patient takes daily aspirin.  She struck her face and wrist.  Patient does not have complaints of other areas of pain.    Past Medical History:  Diagnosis Date  . Atrial tachycardia (HCC)    paroxysmal  . Cancer ([place])    Lung--Status post right upper lobectomy  . Cancer ([place]) 1985   uterine  . Cancer ([place]) 1993   lung with right upper lobectomy  . Cancer ([place]) 1994   colon  . Colon cancer ([place])    Colectomy  . Colon polyps 2002  . Compression fracture   . Esophageal stricture 2007  . Grover's disease   . Hiatal hernia 2007  . Osteoporosis   . Syncope and collapse    Pre-syncope  . Uterine cancer ([place]) 1985   complete hysterectomy  . Venous stasis    chronic  . Vertigo     Patient Active Problem List   Diagnosis Date Noted  . Chest pain, rule out acute myocardial infarction 08/08/2017  . HTN (hypertension) 08/08/2017  . PAF (paroxysmal atrial fibrillation) ([place]) 08/08/2017  . Murmur, cardiac 08/08/2017  . PAT (paroxysmal atrial tachycardia) ([place]) 05/20/2011    Past Surgical History:  Procedure Laterality Date  . CATARACT EXTRACTION    . COLECTOMY    . esophageal dilitation    . HEMORRHOID SURGERY    . INNER EAR SURGERY    . LOBECTOMY     Right  . LOBECTOMY Right 1993  . REPLACEMENT TOTAL KNEE Left 2000  . TOTAL ABDOMINAL HYSTERECTOMY    . TOTAL KNEE ARTHROPLASTY    . VEIN LIGATION AND STRIPPING       OB History   No obstetric history on file.     Family History  Problem Relation Age of Onset  . Asthma Father    . Emphysema Brother   . Emphysema Brother   . Breast cancer Maternal Grandmother   . Lung cancer Sister   . Heart disease Mother     Social History   Tobacco Use  . Smoking status: Never Smoker  Vaping Use  . Vaping Use: Never used  Substance Use Topics  . Alcohol use: No  . Drug use: No    Home Medications Prior to Admission medications   Medication Sig Start Date End Date Taking? Authorizing Provider  acetaminophen (TYLENOL) 325 MG tablet Take 650 mg by mouth every 6 (six) hours as needed for pain.    [provider]  aspirin 81 MG chewable tablet Chew 81 mg by mouth daily.    [provider]  aspirin 81 MG tablet Take 81 mg by mouth daily.      [provider]  Calcium Carbonate Antacid (TUMS PO) Take by mouth.      [provider]  Calcium Carbonate-Vitamin D (OSCAL 500/200 D-3 PO) Take by mouth.      [provider]  Cholecalciferol (VITAMIN D3) 1000 units CHEW Chew 1 tablet by mouth daily.    [provider]  lisinopril (PRINIVIL,ZESTRIL) 20 MG tablet Take 20 mg by mouth daily.      [provider]  lisinopril (PRINIVIL,ZESTRIL) 20 MG tablet Take 20 mg by mouth daily. 06/01/17   [provider]  metoprolol tartrate (LOPRESSOR) 25 MG tablet TAKE 1/2 TABLET DAILY 03/18/11   [place], Peter M, MD  metoprolol tartrate (LOPRESSOR) 25 MG tablet Take 12.5 mg by mouth daily. 07/20/17   [provider]  Multiple Vitamin (MULTI-VITAMIN PO) Take by mouth.      [provider]  Multiple Vitamin (MULTIVITAMIN) tablet Take 1 tablet by mouth daily. gummie    [provider]  rifaximin (XIFAXAN) 550 MG TABS Take 1 tablet (550 mg total) by mouth 2 (two) times daily. 01/13/13   Sable Feil, MD    Allergies    Adhesive [tape], Celebrex [celecoxib], Merthiolate [thimerosal], Neomycin, Penicillins, and Penicillins  Review of Systems   Review of Systems Level 5 caveat cannot obtain complete  review of systems due to patient's extreme hard of hearing. Physical Exam Updated Vital Signs BP (!) 168/91 (BP Location: Right Arm)   Pulse 62   Resp 15   SpO2 96%   Physical Exam Constitutional:      Comments: Clinically well appearance.  Very large facial bruising but no distress.  Respirations calm and unlabored.  Patient is alert and appropriately interactive.  HENT:     Head:     Comments: Very large diffuse hematoma over the left face.  See attached images.    Nose: Nose normal.     Mouth/Throat:     Mouth: Mucous membranes are moist.     Pharynx: Oropharynx is clear.     Comments: No dental injury.  Airway patent.  Voice clear. Eyes:     Extraocular Movements: Extraocular movements intact.     Pupils: Pupils are equal, round, and reactive to light.     Comments: Extraocular motions are normal.  No subconjunctival hemorrhage or appearance of hyphema or injury to the left eye  Neck:     Comments: Cervical collar maintained until C-spine films returned.  No pain to palpation of C-spine Cardiovascular:     Comments: Normal rate and rhythm.  3\6 systolic ejection murmur. Pulmonary:     Comments: No respiratory distress.  Lungs are clear.  No crepitus or pain with compression of the chest wall. Abdominal:     General: There is no distension.     Palpations: Abdomen is soft.     Tenderness: There is no abdominal tenderness. There is no guarding.  Musculoskeletal:     Comments: Large diffuse hematoma over the dorsal aspect of the left hand and wrist.  No apparent deformity.  Patient can move fingers and wrist appropriately.  No appearance of contusions abrasions to the hips or legs.  Patient is able to bend both knees and plantar feet on the bed to elevate the hips for changing of the diaper.  No appearance of pain or limitation to use of the lower legs.  Neurological:     Comments: Patient is extremely hard of hearing.  It requires writing out notes on sheet of paper to  communicate with the patient despite her hearing aid.  However, her speech and content is situationally appropriate normal.  Once she is able to understand via written documents, no sign of confusion or disorientation.  Her verbal responses are appropriate to the situation.  No focal motor deficits.  Psychiatric:        Mood  and Affect: Mood normal.     ED Results / Procedures / Treatments   Labs (all labs ordered are listed, but only abnormal results are displayed) Labs Reviewed - No data to display  EKG None  Radiology CT Head Wo Contrast  Result Date: 12/03/2020 CLINICAL DATA:  Fall.  Facial trauma. EXAM: CT HEAD WITHOUT CONTRAST CT MAXILLOFACIAL WITHOUT CONTRAST CT CERVICAL SPINE WITHOUT CONTRAST TECHNIQUE: Multidetector CT imaging of the head, cervical spine, and maxillofacial structures were performed using the standard protocol without intravenous contrast. Multiplanar CT image reconstructions of the cervical spine and maxillofacial structures were also generated. COMPARISON:  None. FINDINGS: CT HEAD FINDINGS Brain: No evidence of acute large vascular territory infarction, hemorrhage, hydrocephalus, extra-axial collection or mass lesion/mass effect. Patchy white matter hypoattenuation, nonspecific but most likely related to chronic microvascular ischemic disease given patient age. Vascular: Calcific atherosclerosis. No hyperdense vessel identified. Skull: Left frontal/periorbital scalp contusion without acute fracture. Other: Postsurgical changes of right canal wall down mastoidectomy. No sizable mastoid effusions. CT MAXILLOFACIAL FINDINGS Osseous: No fracture or mandibular dislocation. No destructive process. Bilateral temporomandibular joint degenerative change. Orbits: No retro bulbar hematoma. Globes are symmetric within normal limits. No proptosis. Unremarkable extraocular muscles. Sinuses: Mild mucosal thickening of the posterior left maxillary sinus and scattered ethmoid air cells  without air-fluid levels. Otherwise, clear sinuses. Soft tissues: Large left frontal/periorbital contusion. Other: Periapical lucencies of the left mandibular lateral incisor and a left mandibular molar. CT CERVICAL SPINE FINDINGS Alignment: Approximately 3 mm of anterolisthesis of C4 on C5, favored degenerative given severe facet arthropathy at this level. Skull base and vertebrae: No evidence of acute fracture. Vertebral body heights are maintained. Diffuse osteopenia. Soft tissues and spinal canal: No prevertebral fluid or swelling. No visible canal hematoma. Disc levels: Mild-to-moderate multilevel degenerative disc disease with posterior disc osteophyte complexes. Severe facet arthropathy in the upper cervical spine, most pronounced at C2-C3, C3-C4, and C4-C5. Degenerative changes at the craniocervical junction. Upper chest: Area of scarring in the medial right upper lobe, unchanged from 2018 CT chest. Otherwise, visualized lung apices are clear. IMPRESSION: CT head: 1. No evidence of acute intracranial abnormality. 2. Chronic microvascular ischemic disease and generalized atrophy. CT maxillofacial: 1. Large left frontal/periorbital contusion without acute fracture. 2. Bilateral TMJ degenerative change. CT cervical spine: 1. No evidence of acute fracture. 2. Approximately 3 mm of anterolisthesis of C4 on C5, favored degenerative given severe facet arthropathy in the upper cervical spine (including at this level). 3. Partially imaged 1.8 cm left thyroid nodule. Consider thyroid ultrasound to further characterize. 4. Osteopenia. Electronically Signed   By: Margaretha Sheffield MD   On: 12/03/2020 07:39   CT Cervical Spine Wo Contrast  Result Date: 12/03/2020 CLINICAL DATA:  Fall.  Facial trauma. EXAM: CT HEAD WITHOUT CONTRAST CT MAXILLOFACIAL WITHOUT CONTRAST CT CERVICAL SPINE WITHOUT CONTRAST TECHNIQUE: Multidetector CT imaging of the head, cervical spine, and maxillofacial structures were performed using the  standard protocol without intravenous contrast. Multiplanar CT image reconstructions of the cervical spine and maxillofacial structures were also generated. COMPARISON:  None. FINDINGS: CT HEAD FINDINGS Brain: No evidence of acute large vascular territory infarction, hemorrhage, hydrocephalus, extra-axial collection or mass lesion/mass effect. Patchy white matter hypoattenuation, nonspecific but most likely related to chronic microvascular ischemic disease given patient age. Vascular: Calcific atherosclerosis. No hyperdense vessel identified. Skull: Left frontal/periorbital scalp contusion without acute fracture. Other: Postsurgical changes of right canal wall down mastoidectomy. No sizable mastoid effusions. CT MAXILLOFACIAL FINDINGS Osseous: No fracture or mandibular dislocation. No  destructive process. Bilateral temporomandibular joint degenerative change. Orbits: No retro bulbar hematoma. Globes are symmetric within normal limits. No proptosis. Unremarkable extraocular muscles. Sinuses: Mild mucosal thickening of the posterior left maxillary sinus and scattered ethmoid air cells without air-fluid levels. Otherwise, clear sinuses. Soft tissues: Large left frontal/periorbital contusion. Other: Periapical lucencies of the left mandibular lateral incisor and a left mandibular molar. CT CERVICAL SPINE FINDINGS Alignment: Approximately 3 mm of anterolisthesis of C4 on C5, favored degenerative given severe facet arthropathy at this level. Skull base and vertebrae: No evidence of acute fracture. Vertebral body heights are maintained. Diffuse osteopenia. Soft tissues and spinal canal: No prevertebral fluid or swelling. No visible canal hematoma. Disc levels: Mild-to-moderate multilevel degenerative disc disease with posterior disc osteophyte complexes. Severe facet arthropathy in the upper cervical spine, most pronounced at C2-C3, C3-C4, and C4-C5. Degenerative changes at the craniocervical junction. Upper chest: Area of  scarring in the medial right upper lobe, unchanged from 2018 CT chest. Otherwise, visualized lung apices are clear. IMPRESSION: CT head: 1. No evidence of acute intracranial abnormality. 2. Chronic microvascular ischemic disease and generalized atrophy. CT maxillofacial: 1. Large left frontal/periorbital contusion without acute fracture. 2. Bilateral TMJ degenerative change. CT cervical spine: 1. No evidence of acute fracture. 2. Approximately 3 mm of anterolisthesis of C4 on C5, favored degenerative given severe facet arthropathy in the upper cervical spine (including at this level). 3. Partially imaged 1.8 cm left thyroid nodule. Consider thyroid ultrasound to further characterize. 4. Osteopenia. Electronically Signed   By: Margaretha Sheffield MD   On: 12/03/2020 07:39   DG Hand Complete Left  Result Date: 12/03/2020 CLINICAL DATA:  87 year old female status post fall this morning. Soft tissue swelling and bruising. EXAM: LEFT HAND - COMPLETE 3+ VIEW COMPARISON:  None. FINDINGS: Osteopenia. Moderate to severe chronic degeneration at the left wrist, left 1st CMC joint, 5th D IP joint. Chondrocalcinosis at the wrist and in the carpal bones. No superimposed acute fracture or dislocation identified. Generalized soft tissue swelling. IMPRESSION: 1. Generalized soft tissue swelling, osteopenia, and multifocal joint degeneration. 2. No acute fracture or dislocation identified in the left hand. Electronically Signed   By: Genevie Ann M.D.   On: 12/03/2020 07:21   CT Maxillofacial Wo Contrast  Result Date: 12/03/2020 CLINICAL DATA:  Fall.  Facial trauma. EXAM: CT HEAD WITHOUT CONTRAST CT MAXILLOFACIAL WITHOUT CONTRAST CT CERVICAL SPINE WITHOUT CONTRAST TECHNIQUE: Multidetector CT imaging of the head, cervical spine, and maxillofacial structures were performed using the standard protocol without intravenous contrast. Multiplanar CT image reconstructions of the cervical spine and maxillofacial structures were also  generated. COMPARISON:  None. FINDINGS: CT HEAD FINDINGS Brain: No evidence of acute large vascular territory infarction, hemorrhage, hydrocephalus, extra-axial collection or mass lesion/mass effect. Patchy white matter hypoattenuation, nonspecific but most likely related to chronic microvascular ischemic disease given patient age. Vascular: Calcific atherosclerosis. No hyperdense vessel identified. Skull: Left frontal/periorbital scalp contusion without acute fracture. Other: Postsurgical changes of right canal wall down mastoidectomy. No sizable mastoid effusions. CT MAXILLOFACIAL FINDINGS Osseous: No fracture or mandibular dislocation. No destructive process. Bilateral temporomandibular joint degenerative change. Orbits: No retro bulbar hematoma. Globes are symmetric within normal limits. No proptosis. Unremarkable extraocular muscles. Sinuses: Mild mucosal thickening of the posterior left maxillary sinus and scattered ethmoid air cells without air-fluid levels. Otherwise, clear sinuses. Soft tissues: Large left frontal/periorbital contusion. Other: Periapical lucencies of the left mandibular lateral incisor and a left mandibular molar. CT CERVICAL SPINE FINDINGS Alignment: Approximately 3 mm of anterolisthesis  of C4 on C5, favored degenerative given severe facet arthropathy at this level. Skull base and vertebrae: No evidence of acute fracture. Vertebral body heights are maintained. Diffuse osteopenia. Soft tissues and spinal canal: No prevertebral fluid or swelling. No visible canal hematoma. Disc levels: Mild-to-moderate multilevel degenerative disc disease with posterior disc osteophyte complexes. Severe facet arthropathy in the upper cervical spine, most pronounced at C2-C3, C3-C4, and C4-C5. Degenerative changes at the craniocervical junction. Upper chest: Area of scarring in the medial right upper lobe, unchanged from 2018 CT chest. Otherwise, visualized lung apices are clear. IMPRESSION: CT head: 1. No  evidence of acute intracranial abnormality. 2. Chronic microvascular ischemic disease and generalized atrophy. CT maxillofacial: 1. Large left frontal/periorbital contusion without acute fracture. 2. Bilateral TMJ degenerative change. CT cervical spine: 1. No evidence of acute fracture. 2. Approximately 3 mm of anterolisthesis of C4 on C5, favored degenerative given severe facet arthropathy in the upper cervical spine (including at this level). 3. Partially imaged 1.8 cm left thyroid nodule. Consider thyroid ultrasound to further characterize. 4. Osteopenia. Electronically Signed   By: Margaretha Sheffield MD   On: 12/03/2020 07:39    Procedures Procedures   Medications Ordered in ED Medications  acetaminophen (TYLENOL) tablet 1,000 mg (has no administration in time range)    ED Course  I have reviewed the triage vital signs and the nursing notes.  Pertinent labs & imaging results that were available during my care of the patient were reviewed by me and considered in my medical decision making (see chart for details).    MDM Rules/Calculators/A&P                          Patient presents with mechanical fall.  She has very large bruising to the face and left wrist.  Imaging shows no acute fractures of the face or wrist.  Mental status is clear.  I have reviewed the findings with the patient's daughter.  Patient is aware of the plan.  At this time stable to return home to assisted living with her husband.  Return precautions included in discharge instructions.  Recommend ice, elevation and Tylenol for large hematomas.  Recommend recheck with PCP within 2 to 4 days Final Clinical Impression(s) / ED Diagnoses Final diagnoses:  Fall, initial encounter  Facial hematoma, initial encounter  Traumatic hematoma of left wrist, initial encounter    Rx / DC Orders ED Discharge Orders    None       Charlesetta Shanks, MD 12/03/20 [phone]

## 2020-12-03 NOTE — ED Triage Notes (Signed)
Patient arrived from [facility], via EMS. Patient fell while walking to the bathroom. Patient lives with husband at facility.

## 2020-12-03 NOTE — ED Notes (Signed)
Pt in bed, respirations even and unlabored.

## 2020-12-03 NOTE — ED Notes (Signed)
Called report to [facility] at [facility] @[phone] at [phone]

## 2020-12-03 NOTE — ED Notes (Signed)
Daughter called and Probation officer sent a message to MD to call her , Ebony Hail [phone]

## 2020-12-03 NOTE — ED Notes (Signed)
Pt to CT and Xray

## 2020-12-03 NOTE — ED Provider Notes (Addendum)
MSE was initiated and I personally evaluated the patient and placed orders (if any) at  6:23 AM on December 03, 2020.  The patient appears stable so that the remainder of the MSE may be completed by another provider.  Patient awake and conversant.  Large hematoma above and around left eye.  Patient also has hematoma of left dorsal hand.  No significant pain on passive movement of extremities.  Radiograph and CT orders placed.      Soley Harriss, Jenny Reichmann, MD 12/03/20 [id number]    Shanon Rosser, MD 12/03/20 [phone]

## 2020-12-03 NOTE — Discharge Instructions (Signed)
1.  Follow return precautions for head injury and bruising. 2.  You may take extra strength Tylenol every 6 hours for pain.  Apply well wrapped ice packs to your wrist and face for about 20 minutes every 2 hours.  Frozen peas also work well for bruising on the face.  Try to keep your head and upper body up to about 30 degrees with pillows when you are lying down.  Elevating your head helps the swelling go down. 3.  Return to the emergency department if you develop a headache, confusion, nausea and vomiting or other concerning symptoms.

## 2021-01-10 ENCOUNTER — Non-Acute Institutional Stay: Payer: PPO | Admitting: Orthopedic Surgery

## 2021-01-10 ENCOUNTER — Encounter: Payer: Self-pay | Admitting: Orthopedic Surgery

## 2021-01-10 DIAGNOSIS — I10 Essential (primary) hypertension: Secondary | ICD-10-CM

## 2021-01-10 DIAGNOSIS — M40203 Unspecified kyphosis, cervicothoracic region: Secondary | ICD-10-CM | POA: Diagnosis not present

## 2021-01-10 DIAGNOSIS — H9193 Unspecified hearing loss, bilateral: Secondary | ICD-10-CM

## 2021-01-10 DIAGNOSIS — I480 Paroxysmal atrial fibrillation: Secondary | ICD-10-CM | POA: Diagnosis not present

## 2021-01-10 DIAGNOSIS — M810 Age-related osteoporosis without current pathological fracture: Secondary | ICD-10-CM

## 2021-01-10 DIAGNOSIS — S0083XD Contusion of other part of head, subsequent encounter: Secondary | ICD-10-CM | POA: Diagnosis not present

## 2021-01-10 DIAGNOSIS — R296 Repeated falls: Secondary | ICD-10-CM | POA: Diagnosis not present

## 2021-01-10 DIAGNOSIS — R413 Other amnesia: Secondary | ICD-10-CM | POA: Diagnosis not present

## 2021-01-10 NOTE — Progress Notes (Signed)
Provider:  Windell Moulding, NP Location:   [place] Room Number: OB09 Place of Service:  ALF (13)  PCP:  Patient Care Team: Burnard Bunting, MD as PCP - General (Internal Medicine) Burnard Bunting, MD (Internal Medicine)  Extended Emergency Contact Information Primary Emergency Contact: Margo Aye Mobile Phone: [phone] Relation: Daughter Secondary Emergency Contact: Carilyn Goodpasture Address: [address] [place],  [place] Home Phone: [phone] Mobile Phone: [phone] Relation: Spouse  Code Status: FULL CODE Goals of Care: Advanced Directive information Advanced Directives 01/10/2021  Does Patient Have a Medical Advance Directive? Yes  Type of Paramedic of [place];Living will  Does patient want to make changes to medical advance directive? No - Patient declined  Copy of [place] in Chart? Yes - validated most recent copy scanned in chart (See row information)      Chief Complaint  Patient presents with  . Transitions Of Care    New admit to [facility]    HPI: Patient is a 87 y.o. female seen as a new patient in assistive living.   Previously lived in independent living for over 10 years with her husband at [facility]. Past medical history includes: hypertension, atrial fibrillation, scoliosis, osteoporosis, hearing loss and history of neoplasm to colon, uterus, and lung.   Previous provider, Dr. Burnard Bunting.   04/12 she visited [facility] ED due to mechanical fall. She reports walking into bathroom and lost balance. She fell on to her left wrist and face. Left wrist negative for fracture. CT head with no acute abnormality, chronic microvascular ischemic disease and generalized atrophy noted. CT maxillofacial noted left frontal contusion without fracture and bilateral TMJ changes. CT spine no acute fracture, anterolisthesis of C4 and C5, and 1.8 cm left thyroid nodule.  She was discharged home with cervical collar.   Hypertension controlled with lisinopril and metoprolol daily.  Atrial fibrillation rate controlled with metoprolol. Aspirin 81 mg used for anticoagulation.  Osteoporosis treated with vitamin D 1000 unit chew and multivitamin with calcium daily.   She is a poor historian due to her hearing. Husband reports she has had hearing issues since age 34. Past surgery includes stapedectomy. Wears hearing aids daily.   Husband also reports memory issues within the last 6 months. He does not believe it is due to her poor hearing. She is beginning to repeat herself more and forgetful of important dates.    No recent falls. Ambulates with FWW, independent with most ADLs.   Recent blood pressures are as follows:  05/20- 184/91  Recent weight:  05/19- 112 lbs  Nurse does not report any concerns, vitals stable.   Past Medical History:  Diagnosis Date  . Atrial tachycardia (HCC)    paroxysmal  . Cancer ([place])    Lung--Status post right upper lobectomy  . Cancer ([place]) 1985   uterine  . Cancer ([place]) 1993   lung with right upper lobectomy  . Cancer ([place]) 1994   colon  . Colon cancer ([place])    Colectomy  . Colon polyps 2002  . Compression fracture   . Esophageal stricture 2007  . Grover's disease   . Hiatal hernia 2007  . Osteoporosis   . Syncope and collapse    Pre-syncope  . Uterine cancer ([place]) 1985   complete hysterectomy  . Venous stasis    chronic  . Vertigo    Past Surgical History:  Procedure Laterality Date  . CATARACT EXTRACTION    . COLECTOMY    . esophageal dilitation    . HEMORRHOID SURGERY    . INNER EAR SURGERY    . LOBECTOMY     Right  . LOBECTOMY Right 1993  . REPLACEMENT TOTAL KNEE Left 2000  . TOTAL ABDOMINAL HYSTERECTOMY    . TOTAL KNEE ARTHROPLASTY    . VEIN LIGATION AND STRIPPING      reports that she has never smoked. She has never used smokeless tobacco. She reports that she does not drink alcohol and does  not use drugs. Social History   Socioeconomic History  . Marital status: Married    Spouse name: Not on file  . Number of children: 3  . Years of education: Not on file  . Highest education level: Not on file  Occupational History  . Occupation: Retired  Tobacco Use  . Smoking status: Never Smoker  . Smokeless tobacco: Never Used  Vaping Use  . Vaping Use: Never used  Substance and Sexual Activity  . Alcohol use: No  . Drug use: No  . Sexual activity: Not on file  Other Topics Concern  . Not on file  Social History Narrative   ** Merged History Encounter **       Social Determinants of Health   Financial Resource Strain: Not on file  Food Insecurity: Not on file  Transportation Needs: Not on file  Physical Activity: Not on file  Stress: Not on file  Social Connections: Not on file  Intimate Partner Violence: Not on file    Functional Status Survey:    Family History  Problem Relation Age of Onset  . Asthma Father   . Emphysema Brother   . Emphysema Brother   . Breast cancer Maternal Grandmother   . Lung cancer Sister   . Heart disease Mother     Health Maintenance  Topic Date Due  . TETANUS/TDAP  Never done  . DEXA SCAN  Never done  . PNA vac Low Risk Adult (1 of 2 - PCV13) Never done  . INFLUENZA VACCINE  03/24/2021  . COVID-19 Vaccine  Completed  . HPV VACCINES  Aged Out    Allergies  Allergen Reactions  . Adhesive [Tape]   . Celebrex [Celecoxib]   . Merthiolate [Thimerosal]   . Neomycin   . Penicillins Hives  . Penicillins     Allergies as of 01/10/2021      Reactions   Adhesive [tape]    Celebrex [celecoxib]    Merthiolate [thimerosal]    Neomycin    Penicillins Hives   Penicillins       Medication List       Accurate as of Jan 10, 2021  2:59 PM. If you have any questions, ask your nurse or doctor.        STOP taking these medications   acetaminophen 325 MG tablet Commonly known as: TYLENOL Stopped by: Yvonna Alanis, NP    MULTI-VITAMIN PO Stopped by: Yvonna Alanis, NP   multivitamin tablet Stopped by: Yvonna Alanis, NP   OSCAL 500/200 D-3 PO Stopped by: Yvonna Alanis, NP   rifaximin 550 MG Tabs tablet Commonly known as: XIFAXAN Stopped by: Yvonna Alanis, NP   TUMS PO Stopped by: Yvonna Alanis, NP     TAKE these medications   aspirin 81 MG chewable tablet Chew 81 mg by mouth daily. What changed: Another medication with the same name was  removed. Continue taking this medication, and follow the directions you see here. Changed by: Yvonna Alanis, NP   Centrum MultiGummies Chew Chew 1 tablet by mouth daily.   lisinopril 20 MG tablet Commonly known as: ZESTRIL Take 20 mg by mouth daily. What changed: Another medication with the same name was removed. Continue taking this medication, and follow the directions you see here. Changed by: Yvonna Alanis, NP   metoprolol tartrate 25 MG tablet Commonly known as: LOPRESSOR Take 12.5 mg by mouth daily. What changed: Another medication with the same name was removed. Continue taking this medication, and follow the directions you see here. Changed by: Yvonna Alanis, NP   Vitamin D3 25 MCG (1000 UT) Chew Chew 1 tablet by mouth daily.       Review of Systems  Constitutional: Negative for activity change, appetite change, fatigue and fever.  HENT: Positive for hearing loss. Negative for dental problem and trouble swallowing.        Bilateral hearing aids  Eyes: Negative for visual disturbance.       Glasses  Respiratory: Negative for cough, shortness of breath and wheezing.   Cardiovascular: Negative for chest pain and leg swelling.  Gastrointestinal: Negative for abdominal distention, abdominal pain, constipation, diarrhea and nausea.  Genitourinary: Negative for dysuria, frequency and hematuria.  Musculoskeletal: Positive for arthralgias, back pain, gait problem, myalgias and neck pain.  Skin:       Facial bruising  Neurological: Positive for weakness.  Negative for dizziness and headaches.  Hematological: Bruises/bleeds easily.  Psychiatric/Behavioral: Positive for confusion. Negative for dysphoric mood and sleep disturbance. The patient is not nervous/anxious.     Vitals:   01/10/21 1451  Temp: (!) 97 F (36.1 C)  SpO2: 94%  Weight: 112 lb (50.8 kg)  Height: 5' (1.524 m)   Body mass index is 21.87 kg/m. Physical Exam Vitals reviewed.  Constitutional:      General: She is not in acute distress. HENT:     Head: Normocephalic.      Right Ear: There is no impacted cerumen.     Left Ear: There is no impacted cerumen.     Nose: Nose normal.     Mouth/Throat:     Mouth: Mucous membranes are moist.     Pharynx: No posterior oropharyngeal erythema.  Eyes:     General:        Right eye: No discharge.        Left eye: No discharge.  Neck:     Thyroid: No thyroid mass or thyromegaly.  Cardiovascular:     Rate and Rhythm: Normal rate. Rhythm irregular.     Pulses: Normal pulses.     Heart sounds: Normal heart sounds. No murmur heard.   Pulmonary:     Effort: Pulmonary effort is normal. No respiratory distress.     Breath sounds: Normal breath sounds. No wheezing.  Abdominal:     General: Abdomen is flat. Bowel sounds are normal. There is no distension.     Palpations: Abdomen is soft.     Tenderness: There is no abdominal tenderness.  Musculoskeletal:     Cervical back: Normal range of motion.     Right lower leg: No edema.     Left lower leg: No edema.     Comments: Forward neck protrusion, kyphosis  Lymphadenopathy:     Cervical: No cervical adenopathy.  Skin:    General: Skin is warm and dry.     Capillary Refill: Capillary  refill takes less than 2 seconds.  Neurological:     General: No focal deficit present.     Mental Status: She is alert and oriented to person, place, and time. Mental status is at baseline.     Motor: Weakness present.     Gait: Gait abnormal.     Comments: walker  Psychiatric:        Mood  and Affect: Mood normal.        Behavior: Behavior normal.     Labs reviewed: Basic Metabolic Panel: No results for input(s): NA, K, CL, CO2, GLUCOSE, BUN, CREATININE, CALCIUM, MG, PHOS in the last 8760 hours. Liver Function Tests: No results for input(s): AST, ALT, ALKPHOS, BILITOT, PROT, ALBUMIN in the last 8760 hours. No results for input(s): LIPASE, AMYLASE in the last 8760 hours. No results for input(s): AMMONIA in the last 8760 hours. CBC: No results for input(s): WBC, NEUTROABS, HGB, HCT, MCV, PLT in the last 8760 hours. Cardiac Enzymes: No results for input(s): CKTOTAL, CKMB, CKMBINDEX, TROPONINI in the last 8760 hours. BNP: Invalid input(s): POCBNP No results found for: HGBA1C Lab Results  Component Value Date   TSH 1.041 08/08/2017   Lab Results  Component Value Date   VITAMINB12 510 12/30/2006   Lab Results  Component Value Date   FOLATE > 20.0 ng/mL 12/30/2006   No results found for: IRON, TIBC, FERRITIN  Imaging and Procedures obtained prior to SNF admission: CT Head Wo Contrast  Result Date: 12/03/2020 CLINICAL DATA:  Fall.  Facial trauma. EXAM: CT HEAD WITHOUT CONTRAST CT MAXILLOFACIAL WITHOUT CONTRAST CT CERVICAL SPINE WITHOUT CONTRAST TECHNIQUE: Multidetector CT imaging of the head, cervical spine, and maxillofacial structures were performed using the standard protocol without intravenous contrast. Multiplanar CT image reconstructions of the cervical spine and maxillofacial structures were also generated. COMPARISON:  None. FINDINGS: CT HEAD FINDINGS Brain: No evidence of acute large vascular territory infarction, hemorrhage, hydrocephalus, extra-axial collection or mass lesion/mass effect. Patchy white matter hypoattenuation, nonspecific but most likely related to chronic microvascular ischemic disease given patient age. Vascular: Calcific atherosclerosis. No hyperdense vessel identified. Skull: Left frontal/periorbital scalp contusion without acute fracture.  Other: Postsurgical changes of right canal wall down mastoidectomy. No sizable mastoid effusions. CT MAXILLOFACIAL FINDINGS Osseous: No fracture or mandibular dislocation. No destructive process. Bilateral temporomandibular joint degenerative change. Orbits: No retro bulbar hematoma. Globes are symmetric within normal limits. No proptosis. Unremarkable extraocular muscles. Sinuses: Mild mucosal thickening of the posterior left maxillary sinus and scattered ethmoid air cells without air-fluid levels. Otherwise, clear sinuses. Soft tissues: Large left frontal/periorbital contusion. Other: Periapical lucencies of the left mandibular lateral incisor and a left mandibular molar. CT CERVICAL SPINE FINDINGS Alignment: Approximately 3 mm of anterolisthesis of C4 on C5, favored degenerative given severe facet arthropathy at this level. Skull base and vertebrae: No evidence of acute fracture. Vertebral body heights are maintained. Diffuse osteopenia. Soft tissues and spinal canal: No prevertebral fluid or swelling. No visible canal hematoma. Disc levels: Mild-to-moderate multilevel degenerative disc disease with posterior disc osteophyte complexes. Severe facet arthropathy in the upper cervical spine, most pronounced at C2-C3, C3-C4, and C4-C5. Degenerative changes at the craniocervical junction. Upper chest: Area of scarring in the medial right upper lobe, unchanged from 2018 CT chest. Otherwise, visualized lung apices are clear. IMPRESSION: CT head: 1. No evidence of acute intracranial abnormality. 2. Chronic microvascular ischemic disease and generalized atrophy. CT maxillofacial: 1. Large left frontal/periorbital contusion without acute fracture. 2. Bilateral TMJ degenerative change. CT cervical spine: 1.  No evidence of acute fracture. 2. Approximately 3 mm of anterolisthesis of C4 on C5, favored degenerative given severe facet arthropathy in the upper cervical spine (including at this level). 3. Partially imaged 1.8 cm  left thyroid nodule. Consider thyroid ultrasound to further characterize. 4. Osteopenia. Electronically Signed   By: Margaretha Sheffield MD   On: 12/03/2020 07:39   CT Cervical Spine Wo Contrast  Result Date: 12/03/2020 CLINICAL DATA:  Fall.  Facial trauma. EXAM: CT HEAD WITHOUT CONTRAST CT MAXILLOFACIAL WITHOUT CONTRAST CT CERVICAL SPINE WITHOUT CONTRAST TECHNIQUE: Multidetector CT imaging of the head, cervical spine, and maxillofacial structures were performed using the standard protocol without intravenous contrast. Multiplanar CT image reconstructions of the cervical spine and maxillofacial structures were also generated. COMPARISON:  None. FINDINGS: CT HEAD FINDINGS Brain: No evidence of acute large vascular territory infarction, hemorrhage, hydrocephalus, extra-axial collection or mass lesion/mass effect. Patchy white matter hypoattenuation, nonspecific but most likely related to chronic microvascular ischemic disease given patient age. Vascular: Calcific atherosclerosis. No hyperdense vessel identified. Skull: Left frontal/periorbital scalp contusion without acute fracture. Other: Postsurgical changes of right canal wall down mastoidectomy. No sizable mastoid effusions. CT MAXILLOFACIAL FINDINGS Osseous: No fracture or mandibular dislocation. No destructive process. Bilateral temporomandibular joint degenerative change. Orbits: No retro bulbar hematoma. Globes are symmetric within normal limits. No proptosis. Unremarkable extraocular muscles. Sinuses: Mild mucosal thickening of the posterior left maxillary sinus and scattered ethmoid air cells without air-fluid levels. Otherwise, clear sinuses. Soft tissues: Large left frontal/periorbital contusion. Other: Periapical lucencies of the left mandibular lateral incisor and a left mandibular molar. CT CERVICAL SPINE FINDINGS Alignment: Approximately 3 mm of anterolisthesis of C4 on C5, favored degenerative given severe facet arthropathy at this level. Skull base  and vertebrae: No evidence of acute fracture. Vertebral body heights are maintained. Diffuse osteopenia. Soft tissues and spinal canal: No prevertebral fluid or swelling. No visible canal hematoma. Disc levels: Mild-to-moderate multilevel degenerative disc disease with posterior disc osteophyte complexes. Severe facet arthropathy in the upper cervical spine, most pronounced at C2-C3, C3-C4, and C4-C5. Degenerative changes at the craniocervical junction. Upper chest: Area of scarring in the medial right upper lobe, unchanged from 2018 CT chest. Otherwise, visualized lung apices are clear. IMPRESSION: CT head: 1. No evidence of acute intracranial abnormality. 2. Chronic microvascular ischemic disease and generalized atrophy. CT maxillofacial: 1. Large left frontal/periorbital contusion without acute fracture. 2. Bilateral TMJ degenerative change. CT cervical spine: 1. No evidence of acute fracture. 2. Approximately 3 mm of anterolisthesis of C4 on C5, favored degenerative given severe facet arthropathy in the upper cervical spine (including at this level). 3. Partially imaged 1.8 cm left thyroid nodule. Consider thyroid ultrasound to further characterize. 4. Osteopenia. Electronically Signed   By: Margaretha Sheffield MD   On: 12/03/2020 07:39   DG Hand Complete Left  Result Date: 12/03/2020 CLINICAL DATA:  87 year old female status post fall this morning. Soft tissue swelling and bruising. EXAM: LEFT HAND - COMPLETE 3+ VIEW COMPARISON:  None. FINDINGS: Osteopenia. Moderate to severe chronic degeneration at the left wrist, left 1st CMC joint, 5th D IP joint. Chondrocalcinosis at the wrist and in the carpal bones. No superimposed acute fracture or dislocation identified. Generalized soft tissue swelling. IMPRESSION: 1. Generalized soft tissue swelling, osteopenia, and multifocal joint degeneration. 2. No acute fracture or dislocation identified in the left hand. Electronically Signed   By: Genevie Ann M.D.   On:  12/03/2020 07:21   CT Maxillofacial Wo Contrast  Result Date: 12/03/2020 CLINICAL DATA:  Fall.  Facial trauma. EXAM: CT HEAD WITHOUT CONTRAST CT MAXILLOFACIAL WITHOUT CONTRAST CT CERVICAL SPINE WITHOUT CONTRAST TECHNIQUE: Multidetector CT imaging of the head, cervical spine, and maxillofacial structures were performed using the standard protocol without intravenous contrast. Multiplanar CT image reconstructions of the cervical spine and maxillofacial structures were also generated. COMPARISON:  None. FINDINGS: CT HEAD FINDINGS Brain: No evidence of acute large vascular territory infarction, hemorrhage, hydrocephalus, extra-axial collection or mass lesion/mass effect. Patchy white matter hypoattenuation, nonspecific but most likely related to chronic microvascular ischemic disease given patient age. Vascular: Calcific atherosclerosis. No hyperdense vessel identified. Skull: Left frontal/periorbital scalp contusion without acute fracture. Other: Postsurgical changes of right canal wall down mastoidectomy. No sizable mastoid effusions. CT MAXILLOFACIAL FINDINGS Osseous: No fracture or mandibular dislocation. No destructive process. Bilateral temporomandibular joint degenerative change. Orbits: No retro bulbar hematoma. Globes are symmetric within normal limits. No proptosis. Unremarkable extraocular muscles. Sinuses: Mild mucosal thickening of the posterior left maxillary sinus and scattered ethmoid air cells without air-fluid levels. Otherwise, clear sinuses. Soft tissues: Large left frontal/periorbital contusion. Other: Periapical lucencies of the left mandibular lateral incisor and a left mandibular molar. CT CERVICAL SPINE FINDINGS Alignment: Approximately 3 mm of anterolisthesis of C4 on C5, favored degenerative given severe facet arthropathy at this level. Skull base and vertebrae: No evidence of acute fracture. Vertebral body heights are maintained. Diffuse osteopenia. Soft tissues and spinal canal: No  prevertebral fluid or swelling. No visible canal hematoma. Disc levels: Mild-to-moderate multilevel degenerative disc disease with posterior disc osteophyte complexes. Severe facet arthropathy in the upper cervical spine, most pronounced at C2-C3, C3-C4, and C4-C5. Degenerative changes at the craniocervical junction. Upper chest: Area of scarring in the medial right upper lobe, unchanged from 2018 CT chest. Otherwise, visualized lung apices are clear. IMPRESSION: CT head: 1. No evidence of acute intracranial abnormality. 2. Chronic microvascular ischemic disease and generalized atrophy. CT maxillofacial: 1. Large left frontal/periorbital contusion without acute fracture. 2. Bilateral TMJ degenerative change. CT cervical spine: 1. No evidence of acute fracture. 2. Approximately 3 mm of anterolisthesis of C4 on C5, favored degenerative given severe facet arthropathy in the upper cervical spine (including at this level). 3. Partially imaged 1.8 cm left thyroid nodule. Consider thyroid ultrasound to further characterize. 4. Osteopenia. Electronically Signed   By: Margaretha Sheffield MD   On: 12/03/2020 07:39    Assessment/Plan 1. Primary hypertension - elevated today - cont lisinopril and metoprolol - cont low sodium diet  2. PAF (paroxysmal atrial fibrillation) (HCC) - rate controlled with metoprolol - cont aspirin 81 mg daily for anticoagulation  3. Age-related osteoporosis without current pathological fracture - remains high risk for fall with fracture - PT/OT - continue to ambulate with walker - cont falls safety precautions  4. Bilateral hearing loss, unspecified hearing loss type - long history of hearing loss, seen many specialists - history of stapedectomy - difficult to communicate with today - cont bilateral hearing aid use  5. Contusion of face, subsequent encounter - mechanical fall 04/12 - left side of face with some mild discoloration, no swelling  6. Kyphosis of cervicothoracic  region, unspecified kyphosis type - forward neck protrusion present -trouble looking down or bending over - cont FWW - PT/OT  7. Memory impairment - appropriate today - CT head revealed chronic microvascular ischemic disease and generalized atrophy - MMSE  8. Frequent falls - last reported fall 04/12- no fracture - remains high risk for fall with fracture - PT/OT - continue to use FWW -  cont falls safety precautions    Family/ staff Communication: plan discussed with patient, husband and nurse  Labs/tests ordered: MMSE

## 2021-01-13 DIAGNOSIS — R413 Other amnesia: Secondary | ICD-10-CM | POA: Insufficient documentation

## 2021-01-13 DIAGNOSIS — R41841 Cognitive communication deficit: Secondary | ICD-10-CM | POA: Diagnosis not present

## 2021-01-13 DIAGNOSIS — R2689 Other abnormalities of gait and mobility: Secondary | ICD-10-CM | POA: Diagnosis not present

## 2021-01-13 DIAGNOSIS — M810 Age-related osteoporosis without current pathological fracture: Secondary | ICD-10-CM | POA: Diagnosis not present

## 2021-01-13 DIAGNOSIS — Z9181 History of falling: Secondary | ICD-10-CM | POA: Diagnosis not present

## 2021-01-13 DIAGNOSIS — M6281 Muscle weakness (generalized): Secondary | ICD-10-CM | POA: Diagnosis not present

## 2021-01-13 DIAGNOSIS — R1311 Dysphagia, oral phase: Secondary | ICD-10-CM | POA: Diagnosis not present

## 2021-01-13 DIAGNOSIS — R296 Repeated falls: Secondary | ICD-10-CM | POA: Insufficient documentation

## 2021-01-13 DIAGNOSIS — R2681 Unsteadiness on feet: Secondary | ICD-10-CM | POA: Diagnosis not present

## 2021-01-13 DIAGNOSIS — H9193 Unspecified hearing loss, bilateral: Secondary | ICD-10-CM | POA: Insufficient documentation

## 2021-01-14 ENCOUNTER — Encounter: Payer: Self-pay | Admitting: Nurse Practitioner

## 2021-01-14 ENCOUNTER — Non-Acute Institutional Stay: Payer: PPO | Admitting: Nurse Practitioner

## 2021-01-14 DIAGNOSIS — I480 Paroxysmal atrial fibrillation: Secondary | ICD-10-CM

## 2021-01-14 DIAGNOSIS — R413 Other amnesia: Secondary | ICD-10-CM

## 2021-01-14 DIAGNOSIS — R269 Unspecified abnormalities of gait and mobility: Secondary | ICD-10-CM | POA: Diagnosis not present

## 2021-01-14 DIAGNOSIS — I10 Essential (primary) hypertension: Secondary | ICD-10-CM

## 2021-01-14 DIAGNOSIS — E559 Vitamin D deficiency, unspecified: Secondary | ICD-10-CM | POA: Diagnosis not present

## 2021-01-14 DIAGNOSIS — I471 Supraventricular tachycardia: Secondary | ICD-10-CM | POA: Diagnosis not present

## 2021-01-14 NOTE — Assessment & Plan Note (Addendum)
resides in [facility] for safety, care needs. CT head 12/03/20 Chronic microvascular ischemic disease and generalized atrophy. The patient's husband desires the patient self administer meds, will do assessment for safe self administering medications

## 2021-01-14 NOTE — Assessment & Plan Note (Signed)
Blood pressure is controlled, takes Lisinopril, Metoprolol, ASA

## 2021-01-14 NOTE — Assessment & Plan Note (Signed)
Gait abnormality, uses walker, Hx of frequent falls.

## 2021-01-14 NOTE — Assessment & Plan Note (Signed)
Takes Vit D supplement.

## 2021-01-14 NOTE — Assessment & Plan Note (Signed)
Heart rate is in control, takes Metoprolol

## 2021-01-14 NOTE — Progress Notes (Signed)
Location:   [place] Room Number: [place] of Service:  ALF [phone]) Provider:  Verne Lanuza Otho Darner, NP    Patient Care Team: Yvonna Alanis, NP as PCP - General (Adult Health Nurse Practitioner) Burnard Bunting, MD (Internal Medicine)  Extended Emergency Contact Information Primary Emergency Contact: Margo Aye Mobile Phone: [phone] Relation: Daughter Secondary Emergency Contact: Carilyn Goodpasture Address: [address] [place],  [place] Home Phone: [phone] Mobile Phone: [phone] Relation: Spouse  Code Status:  FULL CODE Goals of care: Advanced Directive information Advanced Directives 01/14/2021  Does Patient Have a Medical Advance Directive? Yes  Type of Paramedic of [place];Living will  Does patient want to make changes to medical advance directive? No - Patient declined  Copy of [place] in Chart? Yes - validated most recent copy scanned in chart (See row information)     Chief Complaint  Patient presents with  . Medical Management of Chronic Issues    Patient presents for a medication review.   . Health Maintenance    Discuss need for TD/Tdap vaccine, PNA vaccine, and a DEXA scan.     HPI:  Pt is a 87 y.o. female seen today for medical management of chronic diseases.    HTN, takes Lisinopril, Metoprolol, ASA  RAF/PAT, takes Metoprolol  Memory impairment, resides in [facility] for safety, care needs. CT head 12/03/20 Chronic microvascular ischemic disease and generalized atrophy.  Gait abnormality, uses walker, Hx of frequent falls.   Vit D deficiency, takes Vit D3     Past Medical History:  Diagnosis Date  . Atrial tachycardia (HCC)    paroxysmal  . Cancer ([place])    Lung--Status post right upper lobectomy  . Cancer ([place]) 1985   uterine  . Cancer ([place]) 1993   lung with right upper lobectomy  . Cancer ([place]) 1994   colon  . Colon cancer ([place])    Colectomy  .  Colon polyps 2002  . Compression fracture   . Esophageal stricture 2007  . Grover's disease   . Hiatal hernia 2007  . Osteoporosis   . Syncope and collapse    Pre-syncope  . Uterine cancer ([place]) 1985   complete hysterectomy  . Venous stasis    chronic  . Vertigo    Past Surgical History:  Procedure Laterality Date  . CATARACT EXTRACTION    . COLECTOMY    . esophageal dilitation    . HEMORRHOID SURGERY    . INNER EAR SURGERY    . LOBECTOMY     Right  . LOBECTOMY Right 1993  . REPLACEMENT TOTAL KNEE Left 2000  . TOTAL ABDOMINAL HYSTERECTOMY    . TOTAL KNEE ARTHROPLASTY    . VEIN LIGATION AND STRIPPING      Allergies  Allergen Reactions  . Adhesive [Tape]   . Celebrex [Celecoxib]   . Merthiolate [Thimerosal]   . Neomycin   . Penicillins Hives  . Penicillins     Allergies as of 01/14/2021      Reactions   Adhesive [tape]    Celebrex [celecoxib]    Merthiolate [thimerosal]    Neomycin    Penicillins Hives   Penicillins       Medication List       Accurate as of Jan 14, 2021 11:59 PM. If you have any questions, ask your nurse or doctor.  aspirin 81 MG chewable tablet Chew 81 mg by mouth daily.   Centrum MultiGummies Chew Chew 1 tablet by mouth daily.   lisinopril 20 MG tablet Commonly known as: ZESTRIL Take 20 mg by mouth daily.   metoprolol tartrate 25 MG tablet Commonly known as: LOPRESSOR Take 12.5 mg by mouth daily.   Vitamin D3 25 MCG (1000 UT) Chew Chew 1 tablet by mouth daily.       Review of Systems  Constitutional: Negative for activity change, appetite change and fever.  HENT: Positive for hearing loss. Negative for congestion, trouble swallowing and voice change.   Respiratory: Negative for cough, shortness of breath and wheezing.   Cardiovascular: Negative for chest pain, palpitations and leg swelling.  Gastrointestinal: Negative for abdominal pain, nausea and vomiting.  Genitourinary: Negative for dysuria and urgency.   Musculoskeletal: Positive for gait problem.  Skin: Negative for color change.  Neurological: Negative for weakness, light-headedness and headaches.  Psychiatric/Behavioral: Negative for behavioral problems and sleep disturbance. The patient is not nervous/anxious.     Immunization History  Administered Date(s) Administered  . Moderna SARS-COV2 Booster Vaccination 07/08/2020  . Moderna Sars-Covid-2 Vaccination 08/31/2019, 09/25/2019   Pertinent  Health Maintenance Due  Topic Date Due  . DEXA SCAN  Never done  . PNA vac Low Risk Adult (1 of 2 - PCV13) Never done  . INFLUENZA VACCINE  03/24/2021   No flowsheet data found. Functional Status Survey:    Vitals:   01/14/21 1021  BP: 138/80  Pulse: 90  Resp: 18  Temp: (!) 96.8 F (36 C)  SpO2: 93%  Weight: 112 lb (50.8 kg)  Height: 5' (1.524 m)   Body mass index is 21.87 kg/m. Physical Exam Constitutional:      Appearance: Normal appearance.  HENT:     Head: Normocephalic.     Nose: Nose normal.     Mouth/Throat:     Mouth: Mucous membranes are moist.  Eyes:     Extraocular Movements: Extraocular movements intact.     Conjunctiva/sclera: Conjunctivae normal.     Pupils: Pupils are equal, round, and reactive to light.  Cardiovascular:     Rate and Rhythm: Normal rate and regular rhythm.     Heart sounds: Murmur heard.    Pulmonary:     Effort: Pulmonary effort is normal.     Breath sounds: No wheezing, rhonchi or rales.  Abdominal:     Palpations: Abdomen is soft.     Tenderness: There is no abdominal tenderness.  Musculoskeletal:     Cervical back: Normal range of motion and neck supple.     Right lower leg: No edema.     Left lower leg: No edema.     Comments: kyphosis  Skin:    General: Skin is warm and dry.  Neurological:     General: No focal deficit present.     Mental Status: She is alert and oriented to person, place, and time. Mental status is at baseline.     Gait: Gait abnormal.  Psychiatric:         Mood and Affect: Mood normal.        Behavior: Behavior normal.        Thought Content: Thought content normal.     Labs reviewed: No results for input(s): NA, K, CL, CO2, GLUCOSE, BUN, CREATININE, CALCIUM, MG, PHOS in the last 8760 hours. No results for input(s): AST, ALT, ALKPHOS, BILITOT, PROT, ALBUMIN in the last 8760 hours. No results for input(s):  WBC, NEUTROABS, HGB, HCT, MCV, PLT in the last 8760 hours. Lab Results  Component Value Date   TSH 1.041 08/08/2017   No results found for: HGBA1C No results found for: CHOL, HDL, LDLCALC, LDLDIRECT, TRIG, CHOLHDL  Significant Diagnostic Results in last 30 days:  No results found.  Assessment/Plan HTN (hypertension) Blood pressure is controlled, takes Lisinopril, Metoprolol, ASA   PAF (paroxysmal atrial fibrillation) (HCC) Heart rate is in control, takes Metoprolol   PAT (paroxysmal atrial tachycardia) Heart rate is in control, takes Metoprolol.   Memory impairment resides in [facility] for safety, care needs. CT head 12/03/20 Chronic microvascular ischemic disease and generalized atrophy. The patient's husband desires the patient self administer meds, will do assessment for safe self administering medications   Gait abnormality Gait abnormality, uses walker, Hx of frequent falls.    Vitamin D deficiency Takes Vit D supplement.      Family/ staff Communication: plan of care reviewed with the patient and charge nurse.   Labs/tests ordered:  none  Time spend 40 minutes.

## 2021-01-14 NOTE — Assessment & Plan Note (Signed)
Heart rate is in control, takes Metoprolol.

## 2021-01-15 ENCOUNTER — Encounter: Payer: Self-pay | Admitting: Nurse Practitioner

## 2021-01-22 DIAGNOSIS — M810 Age-related osteoporosis without current pathological fracture: Secondary | ICD-10-CM | POA: Diagnosis not present

## 2021-01-22 DIAGNOSIS — R2689 Other abnormalities of gait and mobility: Secondary | ICD-10-CM | POA: Diagnosis not present

## 2021-01-22 DIAGNOSIS — Z9181 History of falling: Secondary | ICD-10-CM | POA: Diagnosis not present

## 2021-01-22 DIAGNOSIS — R1311 Dysphagia, oral phase: Secondary | ICD-10-CM | POA: Diagnosis not present

## 2021-01-22 DIAGNOSIS — R2681 Unsteadiness on feet: Secondary | ICD-10-CM | POA: Diagnosis not present

## 2021-01-22 DIAGNOSIS — M6281 Muscle weakness (generalized): Secondary | ICD-10-CM | POA: Diagnosis not present

## 2021-01-22 DIAGNOSIS — R41841 Cognitive communication deficit: Secondary | ICD-10-CM | POA: Diagnosis not present

## 2021-02-17 ENCOUNTER — Other Ambulatory Visit: Payer: Self-pay

## 2021-02-17 ENCOUNTER — Encounter (HOSPITAL_COMMUNITY): Payer: Self-pay

## 2021-02-17 ENCOUNTER — Emergency Department (HOSPITAL_COMMUNITY)
Admission: EM | Admit: 2021-02-17 | Discharge: 2021-02-18 | Disposition: A | Discharge status: Home / Self Care | Payer: PPO | Attending: Emergency Medicine | Admitting: Emergency Medicine

## 2021-02-17 ENCOUNTER — Emergency Department (HOSPITAL_COMMUNITY): Payer: PPO

## 2021-02-17 DIAGNOSIS — Z79899 Other long term (current) drug therapy: Secondary | ICD-10-CM | POA: Diagnosis not present

## 2021-02-17 DIAGNOSIS — Z7982 Long term (current) use of aspirin: Secondary | ICD-10-CM | POA: Diagnosis not present

## 2021-02-17 DIAGNOSIS — R079 Chest pain, unspecified: Secondary | ICD-10-CM | POA: Diagnosis not present

## 2021-02-17 DIAGNOSIS — I4891 Unspecified atrial fibrillation: Secondary | ICD-10-CM

## 2021-02-17 DIAGNOSIS — Z8541 Personal history of malignant neoplasm of cervix uteri: Secondary | ICD-10-CM | POA: Diagnosis not present

## 2021-02-17 DIAGNOSIS — I10 Essential (primary) hypertension: Secondary | ICD-10-CM | POA: Diagnosis not present

## 2021-02-17 DIAGNOSIS — R0789 Other chest pain: Secondary | ICD-10-CM | POA: Diagnosis not present

## 2021-02-17 DIAGNOSIS — Z96652 Presence of left artificial knee joint: Secondary | ICD-10-CM | POA: Diagnosis not present

## 2021-02-17 DIAGNOSIS — Z85118 Personal history of other malignant neoplasm of bronchus and lung: Secondary | ICD-10-CM | POA: Diagnosis not present

## 2021-02-17 DIAGNOSIS — I517 Cardiomegaly: Secondary | ICD-10-CM | POA: Diagnosis not present

## 2021-02-17 DIAGNOSIS — Z85038 Personal history of other malignant neoplasm of large intestine: Secondary | ICD-10-CM | POA: Insufficient documentation

## 2021-02-17 DIAGNOSIS — I499 Cardiac arrhythmia, unspecified: Secondary | ICD-10-CM | POA: Diagnosis not present

## 2021-02-17 DIAGNOSIS — Z20822 Contact with and (suspected) exposure to covid-19: Secondary | ICD-10-CM | POA: Diagnosis not present

## 2021-02-17 DIAGNOSIS — R0602 Shortness of breath: Secondary | ICD-10-CM | POA: Diagnosis not present

## 2021-02-17 LAB — BASIC METABOLIC PANEL
Anion gap: 8 (ref 5–15)
BUN: 9 mg/dL (ref 8–23)
CO2: 26 mmol/L (ref 22–32)
Calcium: 8.4 mg/dL — ABNORMAL LOW (ref 8.9–10.3)
Chloride: 100 mmol/L (ref 98–111)
Creatinine, Ser: 0.68 mg/dL (ref 0.44–1.00)
GFR, Estimated: >60 mL/min (ref >60)
Glucose, Bld: 113 mg/dL — ABNORMAL HIGH (ref 70–99)
Potassium: 4.7 mmol/L (ref 3.5–5.1)
Sodium: 134 mmol/L — ABNORMAL LOW (ref 135–145)

## 2021-02-17 LAB — HEPATIC FUNCTION PANEL
ALT: 16 U/L (ref 0–44)
AST: 21 U/L (ref 15–41)
Albumin: 3 g/dL — ABNORMAL LOW (ref 3.5–5.0)
Alkaline Phosphatase: 46 U/L (ref 38–126)
Bilirubin, Direct: 0.1 mg/dL (ref 0.0–0.2)
Indirect Bilirubin: 0.3 mg/dL (ref 0.3–0.9)
Total Bilirubin: 0.4 mg/dL (ref 0.3–1.2)
Total Protein: 5.2 g/dL — ABNORMAL LOW (ref 6.5–8.1)

## 2021-02-17 LAB — CBC WITH DIFFERENTIAL/PLATELET
Abs Immature Granulocytes: 0.06 10*3/uL (ref 0.00–0.07)
Basophils Absolute: 0 10*3/uL (ref 0.0–0.1)
Basophils Relative: 0 %
Eosinophils Absolute: 0 10*3/uL (ref 0.0–0.5)
Eosinophils Relative: 0 %
HCT: 38.2 % (ref 36.0–46.0)
Hemoglobin: 12.8 g/dL (ref 12.0–15.0)
Immature Granulocytes: 1 %
Lymphocytes Relative: 8 %
Lymphs Abs: 0.6 10*3/uL — ABNORMAL LOW (ref 0.7–4.0)
MCH: 32.2 pg (ref 26.0–34.0)
MCHC: 33.5 g/dL (ref 30.0–36.0)
MCV: 96 fL (ref 80.0–100.0)
Monocytes Absolute: 0.6 10*3/uL (ref 0.1–1.0)
Monocytes Relative: 7 %
Neutro Abs: 6.7 10*3/uL (ref 1.7–7.7)
Neutrophils Relative %: 84 %
Platelets: 197 10*3/uL (ref 150–400)
RBC: 3.98 MIL/uL (ref 3.87–5.11)
RDW: 13 % (ref 11.5–15.5)
WBC: 8 10*3/uL (ref 4.0–10.5)
nRBC: 0 % (ref 0.0–0.2)

## 2021-02-17 LAB — URINALYSIS, COMPLETE (UACMP) WITH MICROSCOPIC
Bilirubin Urine: NEGATIVE
Glucose, UA: NEGATIVE mg/dL
Hgb urine dipstick: NEGATIVE
Ketones, ur: NEGATIVE mg/dL
Leukocytes,Ua: NEGATIVE
Nitrite: NEGATIVE
Protein, ur: NEGATIVE mg/dL
Specific Gravity, Urine: 1.011 (ref 1.005–1.030)
pH: 8 (ref 5.0–8.0)

## 2021-02-17 LAB — I-STAT CHEM 8, ED
BUN: 8 mg/dL (ref 8–23)
Calcium, Ion: 1.13 mmol/L — ABNORMAL LOW (ref 1.15–1.40)
Chloride: 98 mmol/L (ref 98–111)
Creatinine, Ser: 0.7 mg/dL (ref 0.44–1.00)
Glucose, Bld: 113 mg/dL — ABNORMAL HIGH (ref 70–99)
HCT: 39 % (ref 36.0–46.0)
Hemoglobin: 13.3 g/dL (ref 12.0–15.0)
Potassium: 4.7 mmol/L (ref 3.5–5.1)
Sodium: 134 mmol/L — ABNORMAL LOW (ref 135–145)
TCO2: 27 mmol/L (ref 22–32)

## 2021-02-17 LAB — RESP PANEL BY RT-PCR (FLU A&B, COVID) ARPGX2
Influenza A by PCR: NEGATIVE
Influenza B by PCR: NEGATIVE
SARS Coronavirus 2 by RT PCR: NEGATIVE

## 2021-02-17 LAB — BRAIN NATRIURETIC PEPTIDE: B Natriuretic Peptide: 110.9 pg/mL — ABNORMAL HIGH (ref 0.0–100.0)

## 2021-02-17 LAB — TROPONIN I (HIGH SENSITIVITY): Troponin I (High Sensitivity): 12 ng/L (ref <18)

## 2021-02-17 NOTE — ED Notes (Signed)
Daughter Ebony Hail [phone] would like an update

## 2021-02-17 NOTE — ED Provider Notes (Signed)
[facility] EMERGENCY DEPARTMENT Provider Note   CSN: [id number] Arrival date & time: 02/17/21  2033     History Chief Complaint  Patient presents with   Atrial Fibrillation    [facility] [name] is a 87 y.o. female.   Atrial Fibrillation Associated symptoms include shortness of breath (improving). Pertinent negatives include no chest pain and no abdominal pain. Patient is a 87 year old female with a history of paroxysmal atrial fibrillation not on any anticoagulation and previous lung cancer status post right upper lobectomy who currently resides in an assisted living facility with her husband who presented to the emergency department via EMS after developing palpitations and shortness of breath this evening approximately 2 hours prior to arrival. EMS reported that her initial heart rate was in the 160s and was consistent with atrial fibrillation with RVR.  She was reportedly hemodynamically stable.  She was given 10 mg IV diltiazem and subsequently converted to a normal sinus rhythm. At the time of my exam, patient was asymptomatic. Communication was limited secondary to extreme hard of hearing.  Level 5 caveat applies.  I called and spoke with the patient's husband with whom she resides.  He says that she was in her normal state of health throughout the day and then developed palpitations and shortness of breath shortly after dinner.  A similar occurrence has happened in the past which she associates with her arrhythmia but it has been several years since it happened before.  I also spoke with the patient's daughter on the telephone.  They do not live in town and were unable to provide much additional history.     Past Medical History:  Diagnosis Date   Atrial tachycardia (HCC)    paroxysmal   Cancer ([place])    Lung--Status post right upper lobectomy   Cancer ([place]) 1985   uterine   Cancer ([place]) 1993   lung with right upper lobectomy   Cancer ([place]) 1994    colon   Colon cancer ([place])    Colectomy   Colon polyps 2002   Compression fracture    Esophageal stricture 2007   Grover's disease    Hiatal hernia 2007   Osteoporosis    Syncope and collapse    Pre-syncope   Uterine cancer ([place]) 1985   complete hysterectomy   Venous stasis    chronic   Vertigo     Patient Active Problem List   Diagnosis Date Noted   Gait abnormality 01/14/2021   Vitamin D deficiency 01/14/2021   Frequent falls 01/13/2021   Memory impairment 01/13/2021   Bilateral hearing loss 01/13/2021   Age-related osteoporosis without current pathological fracture 01/13/2021   Chest pain, rule out acute myocardial infarction 08/08/2017   HTN (hypertension) 08/08/2017   PAF (paroxysmal atrial fibrillation) ([place]) 08/08/2017   Murmur, cardiac 08/08/2017   PAT (paroxysmal atrial tachycardia) ([place]) 05/20/2011    Past Surgical History:  Procedure Laterality Date   CATARACT EXTRACTION     COLECTOMY     esophageal dilitation     HEMORRHOID SURGERY     INNER EAR SURGERY     LOBECTOMY     Right   LOBECTOMY Right 1993   REPLACEMENT TOTAL KNEE Left 2000   TOTAL ABDOMINAL HYSTERECTOMY     TOTAL KNEE ARTHROPLASTY     VEIN LIGATION AND STRIPPING       OB History   No obstetric history on file.     Family History  Problem Relation Age of Onset  Asthma Father    Emphysema Brother    Emphysema Brother    Breast cancer Maternal Grandmother    Lung cancer Sister    Heart disease Mother     Social History   Tobacco Use   Smoking status: Never   Smokeless tobacco: Never  Vaping Use   Vaping Use: Never used  Substance Use Topics   Alcohol use: No   Drug use: No    Home Medications Prior to Admission medications   Medication Sig Start Date End Date Taking? Authorizing Provider  aspirin 81 MG chewable tablet Chew 81 mg by mouth daily.    [provider]  Cholecalciferol (VITAMIN D3) 1000 units CHEW Chew 1 tablet by mouth daily.    [provider]  lisinopril (PRINIVIL,ZESTRIL) 20 MG tablet Take 20 mg by mouth daily.    [provider]  metoprolol tartrate (LOPRESSOR) 25 MG tablet Take 12.5 mg by mouth daily. 07/20/17   [provider]  Multiple Vitamins-Minerals (CENTRUM MULTIGUMMIES) CHEW Chew 1 tablet by mouth daily.    [provider]    Allergies    Adhesive [tape], Celebrex [celecoxib], Merthiolate [thimerosal], Neomycin, Penicillins, and Penicillins  Review of Systems   Review of Systems  Constitutional:  Negative for chills and fever.  HENT:  Negative for ear pain and sore throat.   Eyes:  Negative for pain and visual disturbance.  Respiratory:  Positive for shortness of breath (improving). Negative for cough.   Cardiovascular:  Positive for palpitations (resolved). Negative for chest pain.  Gastrointestinal:  Negative for abdominal pain and vomiting.  Genitourinary:  Negative for dysuria and hematuria.  Musculoskeletal:  Negative for arthralgias and back pain.  Skin:  Negative for color change and rash.  Neurological:  Negative for seizures and syncope.  All other systems reviewed and are negative.  Physical Exam Updated Vital Signs BP (!) 150/77   Pulse 77   Temp 98.1 F (36.7 C) (Oral)   Resp (!) 23   Ht 5' (1.524 m)   Wt 50.8 kg   SpO2 96%   BMI 21.87 kg/m   Physical Exam Vitals and nursing note reviewed.  Constitutional:      General: She is not in acute distress.    Appearance: She is well-developed. She is ill-appearing (chronically).     Comments:  Elderly appearing  HENT:     Head: Normocephalic and atraumatic.  Eyes:     Conjunctiva/sclera: Conjunctivae normal.  Cardiovascular:     Rate and Rhythm: Normal rate and regular rhythm.     Heart sounds: No murmur heard. Pulmonary:     Effort: Pulmonary effort is normal. Tachypnea present. No accessory muscle usage, respiratory distress or retractions.     Breath sounds: Normal breath sounds.  Abdominal:      General: Abdomen is flat.     Palpations: Abdomen is soft.     Tenderness: There is no abdominal tenderness.  Musculoskeletal:     Cervical back: Neck supple.     Right lower leg: No edema.     Left lower leg: No edema.  Skin:    General: Skin is warm and dry.  Neurological:     General: No focal deficit present.     Mental Status: She is alert and oriented to person, place, and time.     GCS: GCS eye subscore is 4. GCS verbal subscore is 5. GCS motor subscore is 6.  Psychiatric:        Behavior:  Behavior is cooperative.    ED Results / Procedures / Treatments   Labs (all labs ordered are listed, but only abnormal results are displayed) Labs Reviewed  BASIC METABOLIC PANEL - Abnormal; Notable for the following components:      Result Value   Sodium 134 (*)    Glucose, Bld 113 (*)    Calcium 8.4 (*)    All other components within normal limits  CBC WITH DIFFERENTIAL/PLATELET - Abnormal; Notable for the following components:   Lymphs Abs 0.6 (*)    All other components within normal limits  BRAIN NATRIURETIC PEPTIDE - Abnormal; Notable for the following components:   B Natriuretic Peptide 110.9 (*)    All other components within normal limits  HEPATIC FUNCTION PANEL - Abnormal; Notable for the following components:   Total Protein 5.2 (*)    Albumin 3.0 (*)    All other components within normal limits  URINALYSIS, COMPLETE (UACMP) WITH MICROSCOPIC - Abnormal; Notable for the following components:   APPearance CLOUDY (*)    Bacteria, UA RARE (*)    All other components within normal limits  I-STAT CHEM 8, ED - Abnormal; Notable for the following components:   Sodium 134 (*)    Glucose, Bld 113 (*)    Calcium, Ion 1.13 (*)    All other components within normal limits  TROPONIN I (HIGH SENSITIVITY) - Abnormal; Notable for the following components:   Troponin I (High Sensitivity) 19 (*)    All other components within normal limits  RESP PANEL BY RT-PCR (FLU A&B, COVID)  ARPGX2  TROPONIN I (HIGH SENSITIVITY)    EKG None  Radiology DG Chest Port 1 View  Result Date: 02/17/2021 CLINICAL DATA:  Shortness of breath. EXAM: PORTABLE CHEST 1 VIEW COMPARISON:  Chest x-ray dated August 07, 2017. FINDINGS: The patient is rotated to the right. Stable cardiomegaly. Normal pulmonary vascularity. No focal consolidation, pleural effusion, or pneumothorax. Chronic mild blunting of both costophrenic angles. No acute osseous abnormality. IMPRESSION: No active disease. Electronically Signed   By: Titus Dubin M.D.   On: 02/17/2021 21:44    Procedures Procedures   Medications Ordered in ED Medications - No data to display  ED Course  I have reviewed the triage vital signs and the nursing notes.  Pertinent labs & imaging results that were available during my care of the patient were reviewed by me and considered in my medical decision making (see chart for details).    MDM Rules/Calculators/A&P                          This is a 87 year old female with history as above who presented to the emergency department via EMS after an apparent episode of atrial fibrillation with RVR that resolved after she was administered IV diltiazem in route. In the emergency department she was hemodynamically stable, afebrile.  EKG in the emergency department consistent with normal sinus rhythm.  No evidence of ischemia or arrhythmia otherwise. Unclear etiology of episode of atrial fibrillation however at this point is resolved. Plan will be to work-up for evidence of infection, metabolic/electrolyte derangements and ACS. Initial troponin was negative.  She continues to be asymptomatic.  Sleeping comfortably.  Not requiring oxygen to maintain normal oxygenation on room air.  No evidence of PNA or fluid on CXR. COVID test negative. Presentation not c/w reactive airway disease.  I called and updated the husband on the patient's current status, plan of care.  Anticipate that she will  be able to be discharged home safely with outpatient primary care follow-up.  Delta troponin 7 (12->19), this is likely secondary to demand ischemia after atrial fibrillation with RVR and much less likely secondary to NSTEMI or ongoing myocardial ischemia.  Would not anticipate such a small delta increase if serious cardiac ischemia was ongoing.  It is also reassuring that the patient remains asymptomatic while in the emergency department. No indication for further workup at this time.   Safe for discharge home with outpatient follow-up.  I spoke with the patient's husband who will set her up with an appointment with her primary care doctor this week.    Final Clinical Impression(s) / ED Diagnoses Final diagnoses:  Atrial fibrillation with RVR [facility])    Rx / DC Orders ED Discharge Orders     None        Pearson Grippe, DO 02/18/21 0021    Carmin Muskrat, MD 02/18/21 2315

## 2021-02-17 NOTE — ED Notes (Signed)
Pt hearing aids are in the small blue bag with the hair pins.

## 2021-02-17 NOTE — ED Triage Notes (Signed)
Patient BIB [facility] from SNF, patient found to be in afib RVR at 160s, 10 mg Cardizem given and patient now in NSR in 90s, patient very [place], denies any pain at this time

## 2021-02-18 ENCOUNTER — Non-Acute Institutional Stay: Payer: PPO | Admitting: Nurse Practitioner

## 2021-02-18 ENCOUNTER — Encounter: Payer: Self-pay | Admitting: Nurse Practitioner

## 2021-02-18 DIAGNOSIS — E559 Vitamin D deficiency, unspecified: Secondary | ICD-10-CM

## 2021-02-18 DIAGNOSIS — R413 Other amnesia: Secondary | ICD-10-CM | POA: Diagnosis not present

## 2021-02-18 DIAGNOSIS — R269 Unspecified abnormalities of gait and mobility: Secondary | ICD-10-CM

## 2021-02-18 DIAGNOSIS — I480 Paroxysmal atrial fibrillation: Secondary | ICD-10-CM

## 2021-02-18 DIAGNOSIS — I10 Essential (primary) hypertension: Secondary | ICD-10-CM | POA: Diagnosis not present

## 2021-02-18 DIAGNOSIS — Z85118 Personal history of other malignant neoplasm of bronchus and lung: Secondary | ICD-10-CM

## 2021-02-18 LAB — TROPONIN I (HIGH SENSITIVITY): Troponin I (High Sensitivity): 19 ng/L — ABNORMAL HIGH (ref <18)

## 2021-02-18 NOTE — Progress Notes (Signed)
Location:   [facility] Room Number: [place] of Service:  ALF [phone]) Provider:  Tay Whitwell Otho Darner, NP    Patient Care Team: Yvonna Alanis, NP as PCP - General (Adult Health Nurse Practitioner) Burnard Bunting, MD (Internal Medicine)  Extended Emergency Contact Information Primary Emergency Contact: Margo Aye Mobile Phone: [phone] Relation: Daughter Secondary Emergency Contact: Carilyn Goodpasture Address: [address] [place],  [place] Home Phone: [phone] Mobile Phone: [phone] Relation: Spouse  Code Status:  FULL CODE Goals of care: Advanced Directive information Advanced Directives 02/18/2021  Does Patient Have a Medical Advance Directive? Yes  Type of Paramedic of [place];Living will  Does patient want to make changes to medical advance directive? No - Patient declined  Copy of [place] in Chart? Yes - validated most recent copy scanned in chart (See row information)  Would patient like information on creating a medical advance directive? -     Chief Complaint  Patient presents with   Hospitalization Follow-up    Emergency room follow up    HPI:  Pt is a 87 y.o. female seen today for f/u ED eval 02/17/21 for Afib RVR, resolved after IV diltiazem 10mg  qd. HR 88bpm today. CMP/BNP/CBC diff, UA, CXR unremrakable. Troponin x2 12 and 19, deemed to to demand ischemia less likely NSTEMI or ongoing cardiac ischemia. Resolved SOB and palpitation. The patient's husband stated the patient should avoid being too warm.    HTN, takes Lisinopril, Metoprolol, ASA             RAF/PAT, takes Metoprolol             Memory impairment, resides in [facility] for safety, care needs. CT head 12/03/20 Chronic microvascular ischemic disease and generalized atrophy.             Gait abnormality, uses walker, Hx of frequent falls.             Vit D deficiency, takes Vit D3  Hx of lung cancer, s/p right  upper lobectomy Past Medical History:  Diagnosis Date   Atrial tachycardia ([place])    paroxysmal   Cancer ([place])    Lung--Status post right upper lobectomy   Cancer ([place]) 1985   uterine   Cancer ([place]) 1993   lung with right upper lobectomy   Cancer ([place]) 1994   colon   Colon cancer ([place])    Colectomy   Colon polyps 2002   Compression fracture    Esophageal stricture 2007   Grover's disease    Hiatal hernia 2007   Osteoporosis    Syncope and collapse    Pre-syncope   Uterine cancer ([place]) 1985   complete hysterectomy   Venous stasis    chronic   Vertigo    Past Surgical History:  Procedure Laterality Date   CATARACT EXTRACTION     COLECTOMY     esophageal dilitation     HEMORRHOID SURGERY     INNER EAR SURGERY     LOBECTOMY     Right   LOBECTOMY Right 1993   REPLACEMENT TOTAL KNEE Left 2000   TOTAL ABDOMINAL HYSTERECTOMY     TOTAL KNEE ARTHROPLASTY     VEIN LIGATION AND STRIPPING      Allergies  Allergen Reactions   Adhesive [Tape]    Celebrex [Celecoxib]    Merthiolate [Thimerosal]    Neomycin    Penicillins Hives  Penicillins     Allergies as of 02/18/2021       Reactions   Adhesive [tape]    Celebrex [celecoxib]    Merthiolate [thimerosal]    Neomycin    Penicillins Hives   Penicillins         Medication List        Accurate as of February 18, 2021 11:49 AM. If you have any questions, ask your nurse or doctor.          acetaminophen 325 MG tablet Commonly known as: TYLENOL Take 650 mg by mouth every 6 (six) hours as needed.   aspirin 81 MG chewable tablet Chew 81 mg by mouth daily.   Centrum MultiGummies Chew Chew 1 tablet by mouth daily.   lisinopril 20 MG tablet Commonly known as: ZESTRIL Take 20 mg by mouth daily.   metoprolol tartrate 25 MG tablet Commonly known as: LOPRESSOR Take 12.5 mg by mouth daily.   Vitamin D3 25 MCG (1000 UT) Chew Chew 1 tablet by mouth daily.        Review of Systems  Constitutional:   Negative for activity change, appetite change and fever.  HENT:  Positive for hearing loss. Negative for congestion, trouble swallowing and voice change.   Respiratory:  Negative for cough, shortness of breath and wheezing.   Cardiovascular:  Positive for leg swelling. Negative for chest pain and palpitations.  Gastrointestinal:  Negative for abdominal pain, nausea and vomiting.  Genitourinary:  Negative for dysuria and urgency.  Musculoskeletal:  Positive for gait problem.  Skin:  Negative for color change.  Neurological:  Negative for weakness, light-headedness and headaches.  Psychiatric/Behavioral:  Negative for behavioral problems and sleep disturbance. The patient is not nervous/anxious.    Immunization History  Administered Date(s) Administered   Moderna SARS-COV2 Booster Vaccination 07/08/2020, 01/29/2021   Moderna Sars-Covid-2 Vaccination 08/31/2019, 09/25/2019   Pertinent  Health Maintenance Due  Topic Date Due   DEXA SCAN  Never done   PNA vac Low Risk Adult (1 of 2 - PCV13) Never done   INFLUENZA VACCINE  03/24/2021   No flowsheet data found. Functional Status Survey:    Vitals:   02/18/21 1102  BP: 138/86  Pulse: 78  Resp: 18  Temp: (!) 97 F (36.1 C)  SpO2: 91%  Weight: 109 lb 12.8 oz (49.8 kg)  Height: 5' (1.524 m)   Body mass index is 21.44 kg/m. Physical Exam Constitutional:      Appearance: Normal appearance.  HENT:     Head: Normocephalic.     Nose: Nose normal.     Mouth/Throat:     Mouth: Mucous membranes are moist.  Eyes:     Extraocular Movements: Extraocular movements intact.     Conjunctiva/sclera: Conjunctivae normal.     Pupils: Pupils are equal, round, and reactive to light.  Cardiovascular:     Rate and Rhythm: Normal rate and regular rhythm.     Heart sounds: Murmur heard.  Pulmonary:     Effort: Pulmonary effort is normal.     Breath sounds: No rales.  Abdominal:     Palpations: Abdomen is soft.     Tenderness: There is no  abdominal tenderness.  Musculoskeletal:     Cervical back: Normal range of motion and neck supple.     Right lower leg: Edema present.     Left lower leg: Edema present.     Comments: Kyphosis. Minimal edema BLE  Skin:    General: Skin is warm and  dry.  Neurological:     General: No focal deficit present.     Mental Status: She is alert and oriented to person, place, and time. Mental status is at baseline.     Gait: Gait abnormal.  Psychiatric:        Mood and Affect: Mood normal.        Behavior: Behavior normal.        Thought Content: Thought content normal.    Labs reviewed: Recent Labs    02/17/21 2102 02/17/21 2123  NA 134* 134*  K 4.7 4.7  CL 100 98  CO2 26  --   GLUCOSE 113* 113*  BUN 9 8  CREATININE 0.68 0.70  CALCIUM 8.4*  --    Recent Labs    02/17/21 2102  AST 21  ALT 16  ALKPHOS 46  BILITOT 0.4  PROT 5.2*  ALBUMIN 3.0*   Recent Labs    02/17/21 2102 02/17/21 2123  WBC 8.0  --   NEUTROABS 6.7  --   HGB 12.8 13.3  HCT 38.2 39.0  MCV 96.0  --   PLT 197  --    Lab Results  Component Value Date   TSH 1.041 08/08/2017   No results found for: HGBA1C No results found for: CHOL, HDL, LDLCALC, LDLDIRECT, TRIG, CHOLHDL  Significant Diagnostic Results in last 30 days:  DG Chest Port 1 View  Result Date: 02/17/2021 CLINICAL DATA:  Shortness of breath. EXAM: PORTABLE CHEST 1 VIEW COMPARISON:  Chest x-ray dated August 07, 2017. FINDINGS: The patient is rotated to the right. Stable cardiomegaly. Normal pulmonary vascularity. No focal consolidation, pleural effusion, or pneumothorax. Chronic mild blunting of both costophrenic angles. No acute osseous abnormality. IMPRESSION: No active disease. Electronically Signed   By: Titus Dubin M.D.   On: 02/17/2021 21:44    Assessment/Plan PAF (paroxysmal atrial fibrillation) ([place]) ED eval 02/17/21 for Afib RVR, resolved after IV diltiazem 10mg  qd. HR 88bpm today. CMP/BNP/CBC diff, UA, CXR unremrakable.  Troponin x2 12 and 19, deemed to to demand ischemia less likely NSTEMI or ongoing cardiac ischemia. Resolved SOB and palpitation. The patient's husband stated the patient should avoid being too warm.   HTN (hypertension) Blood pressure is controlled, takes Lisinopril, Metoprolol, ASA  Memory impairment Memory impairment, resides in [facility] for safety, care needs. CT head 12/03/20 Chronic microvascular ischemic disease and generalized atrophy.  Gait abnormality Gait abnormality, uses walker, Hx of frequent falls.  Vitamin D deficiency Continue Vit D supplement.   History of lung cancer Hx of lung cancer, s/p right upper lobectomy    Family/ staff Communication: plan of care reviewed with the patient and charge nurse.   Labs/tests ordered:  none  Time spend 40 minutes

## 2021-02-18 NOTE — Assessment & Plan Note (Signed)
Gait abnormality, uses walker, Hx of frequent falls.

## 2021-02-18 NOTE — Assessment & Plan Note (Signed)
Hx of lung cancer, s/p right upper lobectomy

## 2021-02-18 NOTE — Assessment & Plan Note (Signed)
ED eval 02/17/21 for Afib RVR, resolved after IV diltiazem 10mg  qd. HR 88bpm today. CMP/BNP/CBC diff, UA, CXR unremrakable. Troponin x2 12 and 19, deemed to to demand ischemia less likely NSTEMI or ongoing cardiac ischemia. Resolved SOB and palpitation. The patient's husband stated the patient should avoid being too warm.

## 2021-02-18 NOTE — ED Notes (Signed)
PTAR Called 

## 2021-02-18 NOTE — Assessment & Plan Note (Signed)
Blood pressure is controlled, takes Lisinopril, Metoprolol, ASA

## 2021-02-18 NOTE — Discharge Instructions (Signed)
You had an episode of atrial fibrillation with a high heart rate. This event stopped after the EMS crew gave you an IV medication that controls heart rate and rhythm. This episode is what likely caused your shortness of breath.  Your heart rate and rhythm have been normal while you have been in the emergency department. There is no evidence that you are having a heart attack. You do not have pneumonia. Your electrolytes are normal.  I want you to schedule an appointment with your primary care doctor to discuss your emergency department visit.  They may want you to be seen by a cardiologist in the future.  I recommend continuing taking all of your medications as prescribed.

## 2021-02-18 NOTE — Assessment & Plan Note (Signed)
Memory impairment, resides in [place] for safety, care needs. CT head 12/03/20 Chronic microvascular ischemic disease and generalized atrophy.

## 2021-02-18 NOTE — Assessment & Plan Note (Signed)
Continue Vit D supplement.

## 2021-02-20 ENCOUNTER — Encounter: Payer: Self-pay | Admitting: Internal Medicine

## 2021-02-20 ENCOUNTER — Non-Acute Institutional Stay: Payer: PPO | Admitting: Internal Medicine

## 2021-02-20 DIAGNOSIS — M810 Age-related osteoporosis without current pathological fracture: Secondary | ICD-10-CM

## 2021-02-20 DIAGNOSIS — R269 Unspecified abnormalities of gait and mobility: Secondary | ICD-10-CM | POA: Diagnosis not present

## 2021-02-20 DIAGNOSIS — Z85118 Personal history of other malignant neoplasm of bronchus and lung: Secondary | ICD-10-CM

## 2021-02-20 DIAGNOSIS — R413 Other amnesia: Secondary | ICD-10-CM

## 2021-02-20 DIAGNOSIS — I10 Essential (primary) hypertension: Secondary | ICD-10-CM

## 2021-02-20 DIAGNOSIS — I480 Paroxysmal atrial fibrillation: Secondary | ICD-10-CM | POA: Diagnosis not present

## 2021-02-20 NOTE — Progress Notes (Signed)
Provider:  Veleta Miners MD Location:   [place] Room Number: 13 Place of Service:  ALF (13)  PCP: Yvonna Alanis, NP Patient Care Team: Yvonna Alanis, NP as PCP - General (Adult Health Nurse Practitioner) Burnard Bunting, MD (Internal Medicine)  Extended Emergency Contact Information Primary Emergency Contact: Margo Aye Mobile Phone: [phone] Relation: Daughter Secondary Emergency Contact: Carilyn Goodpasture Address: [address] [place],  [place] Home Phone: [phone] Mobile Phone: [phone] Relation: Spouse  Code Status: Managed Care Goals of Care: Advanced Directive information Advanced Directives 02/20/2021  Does Patient Have a Medical Advance Directive? Yes  Type of Paramedic of [place];Living will  Does patient want to make changes to medical advance directive? No - Patient declined  Copy of [place] in Chart? Yes - validated most recent copy scanned in chart (See row information)  Would patient like information on creating a medical advance directive? -      Chief Complaint  Patient presents with      AL       HPI: Patient is a 87 y.o. female seen today for AL Follow up She has h/o A Fib Not on any anticoagulation Per cardiology has been doing well on Beta blocker HTN BP has been elevated recently H/o Lung Cancer s/p Right Lobectomy H/o Osteoporosis   She is doing fine No new complains today Walks with her walker Was in ED few days ago for Rapid A Fib which responded to IV Cardizem Weight is stable No Chest pain or SOB     Past Medical History:  Diagnosis Date   Atrial tachycardia (H[facility])    paroxysmal   Cancer ([place])    Lung--Status post right upper lobectomy   Cancer ([place]) 1985   uterine   Cancer ([place]) 1993   lung with right upper lobectomy   Cancer ([place]) 1994   colon   Colon cancer ([place])    Colectomy   Colon polyps 2002   Compression  fracture    Esophageal stricture 2007   Grover's disease    Hiatal hernia 2007   Osteoporosis    Syncope and collapse    Pre-syncope   Uterine cancer ([place]) 1985   complete hysterectomy   Venous stasis    chronic   Vertigo    Past Surgical History:  Procedure Laterality Date   CATARACT EXTRACTION     COLECTOMY     esophageal dilitation     HEMORRHOID SURGERY     INNER EAR SURGERY     LOBECTOMY     Right   LOBECTOMY Right 1993   REPLACEMENT TOTAL KNEE Left 2000   TOTAL ABDOMINAL HYSTERECTOMY     TOTAL KNEE ARTHROPLASTY     VEIN LIGATION AND STRIPPING      reports that she has never smoked. She has never used smokeless tobacco. She reports that she does not drink alcohol and does not use drugs. Social History   Socioeconomic History   Marital status: Married    Spouse name: Not on file   Number of children: 3   Years of education: Not on file   Highest education level: Not on file  Occupational History   Occupation: Retired  Tobacco Use   Smoking status: Never   Smokeless tobacco: Never  Vaping Use   Vaping Use: Never used  Substance and Sexual Activity   Alcohol use: No  Drug use: No   Sexual activity: Not on file  Other Topics Concern   Not on file  Social History Narrative   ** Merged History Encounter **       Social Determinants of Health   Financial Resource Strain: Not on file  Food Insecurity: Not on file  Transportation Needs: Not on file  Physical Activity: Not on file  Stress: Not on file  Social Connections: Not on file  Intimate Partner Violence: Not on file    Functional Status Survey:    Family History  Problem Relation Age of Onset   Asthma Father    Emphysema Brother    Emphysema Brother    Breast cancer Maternal Grandmother    Lung cancer Sister    Heart disease Mother     Health Maintenance  Topic Date Due   TETANUS/TDAP  Never done   Zoster Vaccines- Shingrix (1 of 2) Never done   DEXA SCAN  Never done   PNA vac Low  Risk Adult (1 of 2 - PCV13) Never done   INFLUENZA VACCINE  03/24/2021   COVID-19 Vaccine  Completed   HPV VACCINES  Aged Out    Allergies  Allergen Reactions   Adhesive [Tape]    Celebrex [Celecoxib]    Merthiolate [Thimerosal]    Neomycin    Penicillins Hives   Penicillins     Allergies as of 02/20/2021       Reactions   Adhesive [tape]    Celebrex [celecoxib]    Merthiolate [thimerosal]    Neomycin    Penicillins Hives   Penicillins         Medication List        Accurate as of February 20, 2021 10:08 AM. If you have any questions, ask your nurse or doctor.          acetaminophen 325 MG tablet Commonly known as: TYLENOL Take 650 mg by mouth every 6 (six) hours as needed.   aspirin 81 MG chewable tablet Chew 81 mg by mouth daily.   Centrum MultiGummies Chew Chew 1 tablet by mouth daily.   lisinopril 20 MG tablet Commonly known as: ZESTRIL Take 20 mg by mouth daily.   metoprolol tartrate 25 MG tablet Commonly known as: LOPRESSOR Take 12.5 mg by mouth daily.   Vitamin D3 25 MCG (1000 UT) Chew Chew 1 tablet by mouth daily.        Review of Systems Review of Systems  Constitutional: Negative for activity change, appetite change, chills, diaphoresis, fatigue and fever.  HENT: Negative for mouth sores, postnasal drip, rhinorrhea, sinus pain and sore throat.   Respiratory: Negative for apnea, cough, chest tightness, shortness of breath and wheezing.   Cardiovascular: Negative for chest pain, palpitations and leg swelling.  Gastrointestinal: Negative for abdominal distention, abdominal pain, constipation, diarrhea, nausea and vomiting.  Genitourinary: Negative for dysuria and frequency.  Musculoskeletal: Negative for arthralgias, joint swelling and myalgias.  Skin: Negative for rash.  Neurological: Negative for dizziness, syncope, weakness, light-headedness and numbness.  Psychiatric/Behavioral: Negative for behavioral problems, confusion and sleep  disturbance.    Vitals:   02/20/21 0937  BP: (!) 152/90  Pulse: 77  Resp: (!) 22  Temp: (!) 97 F (36.1 C)  SpO2: 96%  Weight: 109 lb 12.8 oz (49.8 kg)  Height: 5' (1.524 m)   Body mass index is 21.44 kg/m. Physical Exam Constitutional: Oriented to person, place, and time. Well-developed and well-nourished.  HENT:  Head: Normocephalic.  Mouth/Throat: Oropharynx  is clear and moist.  Eyes: Pupils are equal, round, and reactive to light.  Neck: Neck supple.  Cardiovascular: Normal rate and normal heart sounds.  No murmur heard. Pulmonary/Chest: Effort normal and breath sounds normal. No respiratory distress. No wheezes. She has no rales.  Abdominal: Soft. Bowel sounds are normal. No distension. There is no tenderness. There is no rebound.  Musculoskeletal: No edema.  Lymphadenopathy: none Neurological: Alert and oriented to person, place, and time.  Skin: Skin is warm and dry.  Psychiatric: Normal mood and affect. Behavior is normal. Thought content normal.   Labs reviewed: Basic Metabolic Panel: Recent Labs    02/17/21 2102 02/17/21 2123  NA 134* 134*  K 4.7 4.7  CL 100 98  CO2 26  --   GLUCOSE 113* 113*  BUN 9 8  CREATININE 0.68 0.70  CALCIUM 8.4*  --    Liver Function Tests: Recent Labs    02/17/21 2102  AST 21  ALT 16  ALKPHOS 46  BILITOT 0.4  PROT 5.2*  ALBUMIN 3.0*   No results for input(s): LIPASE, AMYLASE in the last 8760 hours. No results for input(s): AMMONIA in the last 8760 hours. CBC: Recent Labs    02/17/21 2102 02/17/21 2123  WBC 8.0  --   NEUTROABS 6.7  --   HGB 12.8 13.3  HCT 38.2 39.0  MCV 96.0  --   PLT 197  --    Cardiac Enzymes: No results for input(s): CKTOTAL, CKMB, CKMBINDEX, TROPONINI in the last 8760 hours. BNP: Invalid input(s): POCBNP No results found for: HGBA1C Lab Results  Component Value Date   TSH 1.041 08/08/2017   Lab Results  Component Value Date   VITAMINB12 510 12/30/2006   Lab Results  Component  Value Date   FOLATE > 20.0 ng/mL 12/30/2006   No results found for: IRON, TIBC, FERRITIN  Imaging and Procedures obtained prior to SNF admission: DG Chest Port 1 View  Result Date: 02/17/2021 CLINICAL DATA:  Shortness of breath. EXAM: PORTABLE CHEST 1 VIEW COMPARISON:  Chest x-ray dated August 07, 2017. FINDINGS: The patient is rotated to the right. Stable cardiomegaly. Normal pulmonary vascularity. No focal consolidation, pleural effusion, or pneumothorax. Chronic mild blunting of both costophrenic angles. No acute osseous abnormality. IMPRESSION: No active disease. Electronically Signed   By: Titus Dubin M.D.   On: 02/17/2021 21:44    Assessment/Plan PAF (paroxysmal atrial fibrillation) (HCC) Will increase her Metoprolol to 25 mg QD This will help her Hypertension also Not on anticoagulation due to Fall risk and Frailty  Primary hypertension On Lisinopril and Metoprolol Dose change for Toprol  Memory impairment Supportive care in [place]  Gait abnormality Walks with the walker Stays Fall Risk History of lung cancer S/p Lobectomy Age-related osteoporosis without current pathological fracture Not sure of previous treatment Will continue Vit D   Family/ staff Communication:   Labs/tests ordered:

## 2021-04-14 ENCOUNTER — Non-Acute Institutional Stay: Payer: PPO | Admitting: Nurse Practitioner

## 2021-04-14 ENCOUNTER — Encounter: Payer: Self-pay | Admitting: Nurse Practitioner

## 2021-04-14 DIAGNOSIS — R269 Unspecified abnormalities of gait and mobility: Secondary | ICD-10-CM

## 2021-04-14 DIAGNOSIS — I10 Essential (primary) hypertension: Secondary | ICD-10-CM

## 2021-04-14 DIAGNOSIS — E559 Vitamin D deficiency, unspecified: Secondary | ICD-10-CM | POA: Diagnosis not present

## 2021-04-14 DIAGNOSIS — G8929 Other chronic pain: Secondary | ICD-10-CM

## 2021-04-14 DIAGNOSIS — R413 Other amnesia: Secondary | ICD-10-CM | POA: Diagnosis not present

## 2021-04-14 DIAGNOSIS — I480 Paroxysmal atrial fibrillation: Secondary | ICD-10-CM

## 2021-04-14 DIAGNOSIS — M25512 Pain in left shoulder: Secondary | ICD-10-CM | POA: Insufficient documentation

## 2021-04-14 NOTE — Progress Notes (Signed)
Location:   [facility] Room Number: RD40 Place of Service:  ALF (13) Provider: Lennie Odor Keiji Melland NP  Yvonna Alanis, NP  Patient Care Team: Yvonna Alanis, NP as PCP - General (Adult Health Nurse Practitioner) Burnard Bunting, MD (Internal Medicine)  Extended Emergency Contact Information Primary Emergency Contact: Margo Aye Mobile Phone: [phone] Relation: Daughter Secondary Emergency Contact: Carilyn Goodpasture Address: [address] [place],  [place] Home Phone: [phone] Mobile Phone: [phone] Relation: Spouse  Code Status:  DNR Goals of care: Advanced Directive information Advanced Directives 04/14/2021  Does Patient Have a Medical Advance Directive? Yes  Type of Paramedic of [place];Living will  Does patient want to make changes to medical advance directive? No - Patient declined  Copy of [place] in Chart? Yes - validated most recent copy scanned in chart (See row information)  Would patient like information on creating a medical advance directive? -     Chief Complaint  Patient presents with   Acute Visit    Patient presents for left shoulder pain     HPI:  Pt is a 87 y.o. female seen today for an acute visit for persisted left shoulder pain, constant, not exacerbating with ROM, no decreased ROM, no point of tenderness palpated. Pain travels down to her left upper arm. Tylenol has no efficacy, desire X-ray to access further and better pain management.   HTN, takes Lisinopril, Metoprolol, ASA             RAF/PAT, takes Metoprolol             Memory impairment, resides in [facility] for safety, care needs. CT head 12/03/20 Chronic microvascular ischemic disease and generalized atrophy.             Gait abnormality, uses walker, Hx of frequent falls.             Vit D deficiency, takes Vit D3             Hx of lung cancer, s/p right upper lobectomy  Past Medical History:  Diagnosis Date    Atrial tachycardia ([place])    paroxysmal   Cancer ([place])    Lung--Status post right upper lobectomy   Cancer ([place]) 1985   uterine   Cancer ([place]) 1993   lung with right upper lobectomy   Cancer ([place]) 1994   colon   Colon cancer ([place])    Colectomy   Colon polyps 2002   Compression fracture    Esophageal stricture 2007   Grover's disease    Hiatal hernia 2007   Osteoporosis    Syncope and collapse    Pre-syncope   Uterine cancer ([place]) 1985   complete hysterectomy   Venous stasis    chronic   Vertigo    Past Surgical History:  Procedure Laterality Date   CATARACT EXTRACTION     COLECTOMY     esophageal dilitation     HEMORRHOID SURGERY     INNER EAR SURGERY     LOBECTOMY     Right   LOBECTOMY Right 1993   REPLACEMENT TOTAL KNEE Left 2000   TOTAL ABDOMINAL HYSTERECTOMY     TOTAL KNEE ARTHROPLASTY     VEIN LIGATION AND STRIPPING      Allergies  Allergen Reactions   Adhesive [Tape]    Celebrex [Celecoxib]    Merthiolate [Thimerosal]    Neomycin  Penicillins Hives   Penicillins     Allergies as of 04/14/2021       Reactions   Adhesive [tape]    Celebrex [celecoxib]    Merthiolate [thimerosal]    Neomycin    Penicillins Hives   Penicillins         Medication List        Accurate as of April 14, 2021 11:59 PM. If you have any questions, ask your nurse or doctor.          STOP taking these medications    acetaminophen 325 MG tablet Commonly known as: TYLENOL Stopped by: Carter Kaman X Haylynn Pha, NP       TAKE these medications    aspirin 81 MG chewable tablet Chew 81 mg by mouth daily.   Centrum MultiGummies Chew Chew 1 tablet by mouth daily.   diclofenac Sodium 1 % Gel Commonly known as: VOLTAREN Apply 1 application topically 3 (three) times daily.   lisinopril 20 MG tablet Commonly known as: ZESTRIL Take 20 mg by mouth daily.   metoprolol succinate 25 MG 24 hr tablet Commonly known as: TOPROL-XL Take 25 mg by mouth daily.   Vitamin D3  25 MCG (1000 UT) Chew Chew 1 tablet by mouth daily.        Review of Systems  Constitutional:  Negative for activity change, appetite change and fever.  HENT:  Positive for hearing loss. Negative for congestion, trouble swallowing and voice change.   Respiratory:  Negative for cough, shortness of breath and wheezing.   Cardiovascular:  Positive for leg swelling. Negative for chest pain and palpitations.  Gastrointestinal:  Negative for abdominal pain, nausea and vomiting.  Genitourinary:  Negative for dysuria and urgency.  Musculoskeletal:  Positive for arthralgias and gait problem.       Left shoulder pain, chronic, persistent, travels to the left upper arm, no decreased ROM of the left shoulder, no point of tenderness palpated.   Skin:  Negative for color change.  Neurological:  Negative for weakness, light-headedness and headaches.  Psychiatric/Behavioral:  Negative for behavioral problems and sleep disturbance. The patient is not nervous/anxious.    Immunization History  Administered Date(s) Administered   Moderna SARS-COV2 Booster Vaccination 07/08/2020, 01/29/2021   Moderna Sars-Covid-2 Vaccination 08/31/2019, 09/25/2019   Pertinent  Health Maintenance Due  Topic Date Due   DEXA SCAN  Never done   PNA vac Low Risk Adult (1 of 2 - PCV13) Never done   INFLUENZA VACCINE  03/24/2021   No flowsheet data found. Functional Status Survey:    Vitals:   04/14/21 1312  BP: (!) 165/83  Pulse: 69  Resp: (!) 22  Temp: (!) 97 F (36.1 C)  SpO2: 94%  Weight: 110 lb 9.6 oz (50.2 kg)  Height: 5' (1.524 m)   Body mass index is 21.6 kg/m. Physical Exam Constitutional:      Appearance: Normal appearance.  HENT:     Head: Normocephalic.     Nose: Nose normal.     Mouth/Throat:     Mouth: Mucous membranes are moist.  Eyes:     Extraocular Movements: Extraocular movements intact.     Conjunctiva/sclera: Conjunctivae normal.     Pupils: Pupils are equal, round, and reactive to  light.  Cardiovascular:     Rate and Rhythm: Normal rate and regular rhythm.     Heart sounds: Murmur heard.  Pulmonary:     Effort: Pulmonary effort is normal.     Breath sounds: No rales.  Abdominal:     Palpations: Abdomen is soft.     Tenderness: There is no abdominal tenderness.  Musculoskeletal:        General: Tenderness present. No deformity.     Cervical back: Normal range of motion and neck supple.     Right lower leg: Edema present.     Left lower leg: Edema present.     Comments: Kyphosis. Minimal edema BLE. Left shoulder pain, chronic, persistent, travels to the left upper arm, no decreased ROM of the left shoulder, no point of tenderness palpate. No spinal spinous processes tenderness noted.   Skin:    General: Skin is warm and dry.  Neurological:     General: No focal deficit present.     Mental Status: She is alert and oriented to person, place, and time. Mental status is at baseline.     Gait: Gait abnormal.  Psychiatric:        Mood and Affect: Mood normal.        Behavior: Behavior normal.        Thought Content: Thought content normal.    Labs reviewed: Recent Labs    02/17/21 2102 02/17/21 2123  NA 134* 134*  K 4.7 4.7  CL 100 98  CO2 26  --   GLUCOSE 113* 113*  BUN 9 8  CREATININE 0.68 0.70  CALCIUM 8.4*  --    Recent Labs    02/17/21 2102  AST 21  ALT 16  ALKPHOS 46  BILITOT 0.4  PROT 5.2*  ALBUMIN 3.0*   Recent Labs    02/17/21 2102 02/17/21 2123  WBC 8.0  --   NEUTROABS 6.7  --   HGB 12.8 13.3  HCT 38.2 39.0  MCV 96.0  --   PLT 197  --    Lab Results  Component Value Date   TSH 1.041 08/08/2017   No results found for: HGBA1C No results found for: CHOL, HDL, LDLCALC, LDLDIRECT, TRIG, CHOLHDL  Significant Diagnostic Results in last 30 days:  No results found.  Assessment/Plan Left shoulder pain ersisted left shoulder pain, constant, not exacerbating with ROM, no decreased ROM, no point of tenderness palpated. Pain  travels down to her left upper arm. Tylenol has no efficacy, desire X-ray to access further and better pain management.  X-ray cervical spine, left shoulder, dc Tylenol, try Gabapentin 100mg  bid. Therapy to eval her walker.   HTN (hypertension) Blood pressure is controlled, continue Lisinopril, Metoprolol .  PAF (paroxysmal atrial fibrillation) (HCC) Heart rate is in control, continue Metoprolol.   Memory impairment Memory impairment, resides in [facility] for safety, care needs. CT head 12/03/20 Chronic microvascular ischemic disease and generalized atrophy.  Gait abnormality Gait abnormality, uses walker, Hx of frequent falls.  Vitamin D deficiency Vit D deficiency, takes Vit D3    Family/ staff Communication: plan of care reviewed with the patient, the patient's husband, and charge nurse.   Labs/tests ordered:  X-ray cervical spine, left shoulder   Time spend 40 minutes.

## 2021-04-14 NOTE — Assessment & Plan Note (Signed)
Gait abnormality, uses walker, Hx of frequent falls.

## 2021-04-14 NOTE — Assessment & Plan Note (Addendum)
ersisted left shoulder pain, constant, not exacerbating with ROM, no decreased ROM, no point of tenderness palpated. Pain travels down to her left upper arm. Tylenol has no efficacy, desire X-ray to access further and better pain management.  X-ray cervical spine, left shoulder, dc Tylenol, try Gabapentin 100mg  bid. Therapy to eval her walker.

## 2021-04-14 NOTE — Assessment & Plan Note (Signed)
Vit D deficiency, takes Vit D3

## 2021-04-14 NOTE — Assessment & Plan Note (Signed)
Blood pressure is controlled, continue Lisinopril, Metoprolol .

## 2021-04-14 NOTE — Assessment & Plan Note (Signed)
Heart rate is in control, continue Metoprolol.

## 2021-04-14 NOTE — Assessment & Plan Note (Signed)
Memory impairment, resides in [facility] for safety, care needs. CT head 12/03/20 Chronic microvascular ischemic disease and generalized atrophy.

## 2021-04-15 DIAGNOSIS — M25512 Pain in left shoulder: Secondary | ICD-10-CM | POA: Diagnosis not present

## 2021-04-15 DIAGNOSIS — M542 Cervicalgia: Secondary | ICD-10-CM | POA: Diagnosis not present

## 2021-04-16 ENCOUNTER — Encounter: Payer: Self-pay | Admitting: Nurse Practitioner

## 2021-04-16 DIAGNOSIS — R2689 Other abnormalities of gait and mobility: Secondary | ICD-10-CM | POA: Diagnosis not present

## 2021-04-16 DIAGNOSIS — Z9181 History of falling: Secondary | ICD-10-CM | POA: Diagnosis not present

## 2021-04-23 ENCOUNTER — Encounter (HOSPITAL_COMMUNITY): Payer: Self-pay | Admitting: Emergency Medicine

## 2021-04-23 ENCOUNTER — Other Ambulatory Visit: Payer: Self-pay

## 2021-04-23 ENCOUNTER — Non-Acute Institutional Stay (INDEPENDENT_AMBULATORY_CARE_PROVIDER_SITE_OTHER): Payer: PPO | Admitting: Orthopedic Surgery

## 2021-04-23 ENCOUNTER — Encounter: Payer: Self-pay | Admitting: Orthopedic Surgery

## 2021-04-23 ENCOUNTER — Inpatient Hospital Stay (HOSPITAL_COMMUNITY)
Admission: EM | Admit: 2021-04-23 | Discharge: 2021-04-26 | DRG: 193 | Disposition: A | Discharge status: Skilled Nursing Facility | Payer: PPO | Source: Skilled Nursing Facility | Attending: Family Medicine | Admitting: Family Medicine

## 2021-04-23 DIAGNOSIS — R55 Syncope and collapse: Secondary | ICD-10-CM | POA: Diagnosis not present

## 2021-04-23 DIAGNOSIS — Z8249 Family history of ischemic heart disease and other diseases of the circulatory system: Secondary | ICD-10-CM

## 2021-04-23 DIAGNOSIS — J90 Pleural effusion, not elsewhere classified: Secondary | ICD-10-CM | POA: Diagnosis not present

## 2021-04-23 DIAGNOSIS — R680 Hypothermia, not associated with low environmental temperature: Secondary | ICD-10-CM | POA: Diagnosis present

## 2021-04-23 DIAGNOSIS — Z85118 Personal history of other malignant neoplasm of bronchus and lung: Secondary | ICD-10-CM

## 2021-04-23 DIAGNOSIS — R600 Localized edema: Secondary | ICD-10-CM | POA: Diagnosis not present

## 2021-04-23 DIAGNOSIS — Z7409 Other reduced mobility: Secondary | ICD-10-CM | POA: Diagnosis present

## 2021-04-23 DIAGNOSIS — I480 Paroxysmal atrial fibrillation: Secondary | ICD-10-CM | POA: Diagnosis not present

## 2021-04-23 DIAGNOSIS — Z888 Allergy status to other drugs, medicaments and biological substances status: Secondary | ICD-10-CM

## 2021-04-23 DIAGNOSIS — Z79899 Other long term (current) drug therapy: Secondary | ICD-10-CM | POA: Diagnosis not present

## 2021-04-23 DIAGNOSIS — Z902 Acquired absence of lung [part of]: Secondary | ICD-10-CM

## 2021-04-23 DIAGNOSIS — M810 Age-related osteoporosis without current pathological fracture: Secondary | ICD-10-CM | POA: Diagnosis present

## 2021-04-23 DIAGNOSIS — Z880 Allergy status to penicillin: Secondary | ICD-10-CM | POA: Diagnosis not present

## 2021-04-23 DIAGNOSIS — Z20822 Contact with and (suspected) exposure to covid-19: Secondary | ICD-10-CM | POA: Diagnosis present

## 2021-04-23 DIAGNOSIS — Z91048 Other nonmedicinal substance allergy status: Secondary | ICD-10-CM | POA: Diagnosis not present

## 2021-04-23 DIAGNOSIS — J189 Pneumonia, unspecified organism: Principal | ICD-10-CM | POA: Diagnosis present

## 2021-04-23 DIAGNOSIS — Z801 Family history of malignant neoplasm of trachea, bronchus and lung: Secondary | ICD-10-CM

## 2021-04-23 DIAGNOSIS — Z7982 Long term (current) use of aspirin: Secondary | ICD-10-CM

## 2021-04-23 DIAGNOSIS — Z90710 Acquired absence of both cervix and uterus: Secondary | ICD-10-CM

## 2021-04-23 DIAGNOSIS — I890 Lymphedema, not elsewhere classified: Secondary | ICD-10-CM | POA: Diagnosis present

## 2021-04-23 DIAGNOSIS — I10 Essential (primary) hypertension: Secondary | ICD-10-CM | POA: Diagnosis not present

## 2021-04-23 DIAGNOSIS — R531 Weakness: Secondary | ICD-10-CM | POA: Diagnosis not present

## 2021-04-23 DIAGNOSIS — H9193 Unspecified hearing loss, bilateral: Secondary | ICD-10-CM | POA: Diagnosis not present

## 2021-04-23 DIAGNOSIS — Z66 Do not resuscitate: Secondary | ICD-10-CM | POA: Diagnosis not present

## 2021-04-23 DIAGNOSIS — R0902 Hypoxemia: Secondary | ICD-10-CM | POA: Diagnosis not present

## 2021-04-23 DIAGNOSIS — Z9049 Acquired absence of other specified parts of digestive tract: Secondary | ICD-10-CM

## 2021-04-23 DIAGNOSIS — I872 Venous insufficiency (chronic) (peripheral): Secondary | ICD-10-CM | POA: Diagnosis not present

## 2021-04-23 DIAGNOSIS — I110 Hypertensive heart disease with heart failure: Secondary | ICD-10-CM | POA: Diagnosis not present

## 2021-04-23 DIAGNOSIS — I517 Cardiomegaly: Secondary | ICD-10-CM | POA: Diagnosis not present

## 2021-04-23 DIAGNOSIS — J9601 Acute respiratory failure with hypoxia: Secondary | ICD-10-CM | POA: Diagnosis not present

## 2021-04-23 DIAGNOSIS — I5032 Chronic diastolic (congestive) heart failure: Secondary | ICD-10-CM | POA: Diagnosis not present

## 2021-04-23 DIAGNOSIS — Z8542 Personal history of malignant neoplasm of other parts of uterus: Secondary | ICD-10-CM

## 2021-04-23 DIAGNOSIS — I5022 Chronic systolic (congestive) heart failure: Secondary | ICD-10-CM | POA: Diagnosis not present

## 2021-04-23 DIAGNOSIS — I700 Atherosclerosis of aorta: Secondary | ICD-10-CM | POA: Diagnosis not present

## 2021-04-23 DIAGNOSIS — I5021 Acute systolic (congestive) heart failure: Secondary | ICD-10-CM | POA: Diagnosis not present

## 2021-04-23 DIAGNOSIS — Z0000 Encounter for general adult medical examination without abnormal findings: Secondary | ICD-10-CM

## 2021-04-23 DIAGNOSIS — Z85038 Personal history of other malignant neoplasm of large intestine: Secondary | ICD-10-CM | POA: Diagnosis not present

## 2021-04-23 DIAGNOSIS — R079 Chest pain, unspecified: Secondary | ICD-10-CM | POA: Diagnosis not present

## 2021-04-23 DIAGNOSIS — H8110 Benign paroxysmal vertigo, unspecified ear: Secondary | ICD-10-CM | POA: Diagnosis not present

## 2021-04-23 DIAGNOSIS — R0602 Shortness of breath: Secondary | ICD-10-CM | POA: Diagnosis not present

## 2021-04-23 DIAGNOSIS — J9811 Atelectasis: Secondary | ICD-10-CM | POA: Diagnosis not present

## 2021-04-23 LAB — CBC WITH DIFFERENTIAL/PLATELET
Abs Immature Granulocytes: 0.03 10*3/uL (ref 0.00–0.07)
Basophils Absolute: 0 10*3/uL (ref 0.0–0.1)
Basophils Relative: 0 %
Eosinophils Absolute: 0 10*3/uL (ref 0.0–0.5)
Eosinophils Relative: 1 %
HCT: 41.3 % (ref 36.0–46.0)
Hemoglobin: 13.8 g/dL (ref 12.0–15.0)
Immature Granulocytes: 1 %
Lymphocytes Relative: 14 %
Lymphs Abs: 0.9 10*3/uL (ref 0.7–4.0)
MCH: 31.4 pg (ref 26.0–34.0)
MCHC: 33.4 g/dL (ref 30.0–36.0)
MCV: 93.9 fL (ref 80.0–100.0)
Monocytes Absolute: 0.6 10*3/uL (ref 0.1–1.0)
Monocytes Relative: 9 %
Neutro Abs: 4.9 10*3/uL (ref 1.7–7.7)
Neutrophils Relative %: 75 %
Platelets: 158 10*3/uL (ref 150–400)
RBC: 4.4 MIL/uL (ref 3.87–5.11)
RDW: 14 % (ref 11.5–15.5)
WBC: 6.6 10*3/uL (ref 4.0–10.5)
nRBC: 0 % (ref 0.0–0.2)

## 2021-04-23 NOTE — ED Notes (Signed)
Daughter Ebony Hail [phone] would like an update

## 2021-04-23 NOTE — Patient Instructions (Addendum)
  Ms. [name] , Thank you for taking time to come for your Medicare Wellness Visit. I appreciate your ongoing commitment to your health goals. Please review the following plan we discussed and let me know if I can assist you in the future.   These are the goals we discussed:  Goals      Maintain Mobility and Function     Evidence-based guidance:  Emphasize the importance of physical activity and aerobic exercise as included in treatment plan; assess barriers to adherence; consider patient's abilities and preferences.  Encourage gradual increase in activity or exercise instead of stopping if pain occurs.  Reinforce individual therapy exercise prescription, such as strengthening, stabilization and stretching programs.  Promote optimal body mechanics to stabilize the spine with lifting and functional activity.  Encourage activity and mobility modifications to facilitate optimal function, such as using a log roll for bed mobility or dressing from a seated position.  Reinforce individual adaptive equipment recommendations to limit excessive spinal movements, such as a Systems analyst.  Assess adequacy of sleep; encourage use of sleep hygiene techniques, such as bedtime routine; use of white noise; dark, cool bedroom; avoiding daytime naps, heavy meals or exercise before bedtime.  Promote positions and modification to optimize sleep and sexual activity; consider pillows or positioning devices to assist in maintaining neutral spine.  Explore options for applying ergonomic principles at work and home, such as frequent position changes, using ergonomically designed equipment and working at optimal height.  Promote modifications to increase comfort with driving such as lumbar support, optimizing seat and steering wheel position, using cruise control and taking frequent rest stops to stretch and walk.   Notes:         This is a list of the screening recommended for you and due dates:  Health  Maintenance  Topic Date Due   Tetanus Vaccine  Never done   Zoster (Shingles) Vaccine (1 of 2) Never done   DEXA scan (bone density measurement)  Never done   Pneumonia vaccines (1 of 2 - PCV13) Never done   Flu Shot  03/24/2021   COVID-19 Vaccine  Completed   HPV Vaccine  Aged Out   I plan to have my office contact Dr. Joya Salm and obtain immunizations. Will discuss next week during routine visit.

## 2021-04-23 NOTE — ED Notes (Signed)
Patient placed in recliner for comfort

## 2021-04-23 NOTE — Progress Notes (Signed)
Subjective:   [name] is a 87 y.o. female who presents for Medicare Annual (Subsequent) preventive examination.  Review of Systems     Cardiac Risk Factors include: advanced age (>63men, >28 women);hypertension;sedentary lifestyle     Objective:    Today's Vitals   04/23/21 0925 04/23/21 1200  BP: (!) 148/81   Pulse: 80   Resp: (!) 22   Temp: (!) 97 F (36.1 C)   SpO2: 93%   Weight: 110 lb 9.6 oz (50.2 kg)   Height: 5' (1.524 m)   PainSc:  3    Body mass index is 21.6 kg/m.  Advanced Directives 04/23/2021 04/14/2021 02/20/2021 02/18/2021 02/17/2021 01/14/2021 01/10/2021  Does Patient Have a Medical Advance Directive? Yes Yes Yes Yes No Yes Yes  Type of Paramedic of [place];Living will [place];Living will [place];Living will [place];Living will - [place];Living will [place];Living will  Does patient want to make changes to medical advance directive? No - Patient declined No - Patient declined No - Patient declined No - Patient declined - No - Patient declined No - Patient declined  Copy of [place] in Chart? Yes - validated most recent copy scanned in chart (See row information) Yes - validated most recent copy scanned in chart (See row information) Yes - validated most recent copy scanned in chart (See row information) Yes - validated most recent copy scanned in chart (See row information) - Yes - validated most recent copy scanned in chart (See row information) Yes - validated most recent copy scanned in chart (See row information)  Would patient like information on creating a medical advance directive? - - - - No - Patient declined - -    Current Medications (verified) Outpatient Encounter Medications as of 04/23/2021  Medication Sig   aspirin 81 MG chewable tablet Chew 81 mg by mouth daily.   Cholecalciferol (VITAMIN  D3) 1000 units CHEW Chew 1 tablet by mouth daily.   diclofenac Sodium (VOLTAREN) 1 % GEL Apply 1 application topically 3 (three) times daily.   gabapentin (NEURONTIN) 100 MG capsule Take 100 mg by mouth 2 (two) times daily.   lisinopril (PRINIVIL,ZESTRIL) 20 MG tablet Take 20 mg by mouth daily.   metoprolol succinate (TOPROL-XL) 25 MG 24 hr tablet Take 25 mg by mouth daily.   Multiple Vitamins-Minerals (CENTRUM MULTIGUMMIES) CHEW Chew 1 tablet by mouth daily.   No facility-administered encounter medications on file as of 04/23/2021.    Allergies (verified) Adhesive [tape], Celebrex [celecoxib], Merthiolate [thimerosal], Neomycin, Penicillins, and Penicillins   History: Past Medical History:  Diagnosis Date   Atrial tachycardia ([place])    paroxysmal   Cancer ([place])    Lung--Status post right upper lobectomy   Cancer ([place]) 1985   uterine   Cancer ([place]) 1993   lung with right upper lobectomy   Cancer ([place]) 1994   colon   Colon cancer ([place])    Colectomy   Colon polyps 2002   Compression fracture    Esophageal stricture 2007   Grover's disease    Hiatal hernia 2007   Osteoporosis    Syncope and collapse    Pre-syncope   Uterine cancer ([place]) 1985   complete hysterectomy   Venous stasis    chronic   Vertigo    Past Surgical History:  Procedure Laterality Date   CATARACT EXTRACTION     COLECTOMY  esophageal dilitation     HEMORRHOID SURGERY     INNER EAR SURGERY     LOBECTOMY     Right   LOBECTOMY Right 1993   REPLACEMENT TOTAL KNEE Left 2000   TOTAL ABDOMINAL HYSTERECTOMY     TOTAL KNEE ARTHROPLASTY     VEIN LIGATION AND STRIPPING     Family History  Problem Relation Age of Onset   Asthma Father    Emphysema Brother    Emphysema Brother    Breast cancer Maternal Grandmother    Lung cancer Sister    Heart disease Mother    Social History   Socioeconomic History   Marital status: Married    Spouse name: Not on file   Number of children: 3   Years of  education: Not on file   Highest education level: Not on file  Occupational History   Occupation: Retired  Tobacco Use   Smoking status: Never   Smokeless tobacco: Never  Vaping Use   Vaping Use: Never used  Substance and Sexual Activity   Alcohol use: No   Drug use: No   Sexual activity: Not on file  Other Topics Concern   Not on file  Social History Narrative   ** Merged History Encounter **       Social Determinants of Health   Financial Resource Strain: Low Risk    Difficulty of Paying Living Expenses: Not hard at all  Food Insecurity: No Food Insecurity   Worried About Charity fundraiser in the Last Year: Never true   [place] in the Last Year: Never true  Transportation Needs: No Transportation Needs   Lack of Transportation (Medical): No   Lack of Transportation (Non-Medical): No  Physical Activity: Inactive   Days of Exercise per Week: 0 days   Minutes of Exercise per Session: 10 min  Stress: No Stress Concern Present   Feeling of Stress : Only a little  Social Connections: Moderately Isolated   Frequency of Communication with Friends and Family: Three times a week   Frequency of Social Gatherings with Friends and Family: Twice a week   Attends Religious Services: Never   Marine scientist or Organizations: No   Attends Music therapist: Never   Marital Status: Married    Tobacco Counseling Counseling given: Not Answered   Clinical Intake:  Pre-visit preparation completed: Yes  Pain : 0-10 Pain Score: 3  Pain Type: Chronic pain Pain Location: Back Pain Orientation: Mid Pain Radiating Towards: Shoulder Pain Descriptors / Indicators: Sore, Tightness Pain Onset: More than a month ago Pain Frequency: Intermittent Pain Relieving Factors: tylenol Effect of Pain on Daily Activities: limits mobility  Pain Relieving Factors: tylenol  BMI - recorded: 21.6 Nutritional Status: BMI of 19-24  Normal Nutritional Risks:  None Diabetes: No  How often do you need to have someone help you when you read instructions, pamphlets, or other written materials from your doctor or pharmacy?: 4 - Often What is the last grade level you completed in school?: High school  Diabetic?No  Interpreter Needed?: No      Activities of Daily Living In your present state of health, do you have any difficulty performing the following activities: 04/23/2021  Hearing? Y  Vision? N  Difficulty concentrating or making decisions? N  Walking or climbing stairs? Y  Dressing or bathing? N  Doing errands, shopping? Y  Preparing Food and eating ? Y  Using the Toilet? N  In  the past six months, have you accidently leaked urine? N  Do you have problems with loss of bowel control? N  Managing your Medications? Y  Managing your Finances? Y  Housekeeping or managing your Housekeeping? Y  Some recent data might be hidden    Patient Care Team: Yvonna Alanis, NP as PCP - General (Adult Health Nurse Practitioner) Burnard Bunting, MD (Internal Medicine)  Indicate any recent Medical Services you may have received from other than [facility] providers in the past year (date may be approximate).     Assessment:   This is a routine wellness examination for [name].  Hearing/Vision screen No results found.  Dietary issues and exercise activities discussed: Current Exercise Habits: The patient does not participate in regular exercise at present, Exercise limited by: cardiac condition(s);orthopedic condition(s)   Goals Addressed             This Visit's Progress    Maintain Mobility and Function   On track    Evidence-based guidance:  Emphasize the importance of physical activity and aerobic exercise as included in treatment plan; assess barriers to adherence; consider patient's abilities and preferences.  Encourage gradual increase in activity or exercise instead of stopping if pain occurs.  Reinforce individual therapy exercise  prescription, such as strengthening, stabilization and stretching programs.  Promote optimal body mechanics to stabilize the spine with lifting and functional activity.  Encourage activity and mobility modifications to facilitate optimal function, such as using a log roll for bed mobility or dressing from a seated position.  Reinforce individual adaptive equipment recommendations to limit excessive spinal movements, such as a Systems analyst.  Assess adequacy of sleep; encourage use of sleep hygiene techniques, such as bedtime routine; use of white noise; dark, cool bedroom; avoiding daytime naps, heavy meals or exercise before bedtime.  Promote positions and modification to optimize sleep and sexual activity; consider pillows or positioning devices to assist in maintaining neutral spine.  Explore options for applying ergonomic principles at work and home, such as frequent position changes, using ergonomically designed equipment and working at optimal height.  Promote modifications to increase comfort with driving such as lumbar support, optimizing seat and steering wheel position, using cruise control and taking frequent rest stops to stretch and walk.   Notes:        Depression Screen PHQ 2/9 Scores 04/23/2021  PHQ - 2 Score 1    Fall Risk Fall Risk  04/23/2021  Falls in the past year? 1  Number falls in past yr: 1  Injury with Fall? 1  Risk for fall due to : History of fall(s);Impaired balance/gait;Impaired mobility  Follow up Falls evaluation completed;Education provided;Falls prevention discussed    FALL RISK PREVENTION PERTAINING TO THE HOME:  Any stairs in or around the home? No  If so, are there any without handrails? No  Home free of loose throw rugs in walkways, pet beds, electrical cords, etc? Yes  Adequate lighting in your home to reduce risk of falls? Yes   ASSISTIVE DEVICES UTILIZED TO PREVENT FALLS:  Life alert? No  Use of a cane, walker or w/c? Yes  Grab bars  in the bathroom? Yes  Shower chair or bench in shower? Yes  Elevated toilet seat or a handicapped toilet? Yes   TIMED UP AND GO:  Was the test performed? No .  Length of time to ambulate 10 feet: N/A sec.   Gait slow and steady with assistive device  Cognitive Function: MMSE - Mini Mental State  Exam 04/23/2021  Not completed: Refused     6CIT Screen 04/23/2021  What Year? 0 points  What month? 0 points  What time? 0 points  Count back from 20 0 points  Months in reverse 0 points  Repeat phrase 2 points  Total Score 2    Immunizations Immunization History  Administered Date(s) Administered   Moderna SARS-COV2 Booster Vaccination 07/08/2020, 01/29/2021   Moderna Sars-Covid-2 Vaccination 08/31/2019, 09/25/2019    TDAP status: Due, Education has been provided regarding the importance of this vaccine. Advised may receive this vaccine at local pharmacy or Health Dept. Aware to provide a copy of the vaccination record if obtained from local pharmacy or Health Dept. Verbalized acceptance and understanding.  Flu Vaccine status: Due, Education has been provided regarding the importance of this vaccine. Advised may receive this vaccine at local pharmacy or Health Dept. Aware to provide a copy of the vaccination record if obtained from local pharmacy or Health Dept. Verbalized acceptance and understanding.  Pneumococcal vaccine status: Due, Education has been provided regarding the importance of this vaccine. Advised may receive this vaccine at local pharmacy or Health Dept. Aware to provide a copy of the vaccination record if obtained from local pharmacy or Health Dept. Verbalized acceptance and understanding.  Covid-19 vaccine status: Completed vaccines  Qualifies for Shingles Vaccine? Yes   Zostavax completed No   Shingrix Completed?: No.    Education has been provided regarding the importance of this vaccine. Patient has been advised to call insurance company to determine out of  pocket expense if they have not yet received this vaccine. Advised may also receive vaccine at local pharmacy or Health Dept. Verbalized acceptance and understanding.  Screening Tests Health Maintenance  Topic Date Due   TETANUS/TDAP  Never done   Zoster Vaccines- Shingrix (1 of 2) Never done   DEXA SCAN  Never done   PNA vac Low Risk Adult (1 of 2 - PCV13) Never done   INFLUENZA VACCINE  03/24/2021   COVID-19 Vaccine  Completed   HPV VACCINES  Aged Out    Health Maintenance  Health Maintenance Due  Topic Date Due   TETANUS/TDAP  Never done   Zoster Vaccines- Shingrix (1 of 2) Never done   DEXA SCAN  Never done   PNA vac Low Risk Adult (1 of 2 - PCV13) Never done   INFLUENZA VACCINE  03/24/2021    Colorectal cancer screening: No longer required.   Mammogram status: No longer required due to advanced age.  Bone Density status: Completed unknown. Results reflect: Bone density results: OSTEOPOROSIS. Repeat every does not wish to schedule years.  Lung Cancer Screening: (Low Dose CT Chest recommended if Age 13-80 years, 30 pack-year currently smoking OR have quit w/in 15years.) does not qualify.   Lung Cancer Screening Referral: No  Additional Screening:  Hepatitis C Screening: does not qualify; Completed - not done due to advanced age  Vision Screening: Recommended annual ophthalmology exams for early detection of glaucoma and other disorders of the eye. Is the patient up to date with their annual eye exam?  Yes  Who is the provider or what is the name of the office in which the patient attends annual eye exams? N/A If pt is not established with a provider, would they like to be referred to a provider to establish care? No .   Dental Screening: Recommended annual dental exams for proper oral hygiene  Community Resource Referral / Chronic Care Management: CRR required this visit?  No   CCM required this visit?  No      Plan:     I have personally reviewed and noted  the following in the patient's chart:   Medical and social history Use of alcohol, tobacco or illicit drugs  Current medications and supplements including opioid prescriptions.  Functional ability and status Nutritional status Physical activity Advanced directives List of other physicians Hospitalizations, surgeries, and ER visits in previous 12 months Vitals Screenings to include cognitive, depression, and falls Referrals and appointments  In addition, I have reviewed and discussed with patient certain preventive protocols, quality metrics, and best practice recommendations. A written personalized care plan for preventive services as well as general preventive health recommendations were provided to patient.     Yvonna Alanis, NP   04/23/2021

## 2021-04-23 NOTE — Progress Notes (Signed)
Location:  [facility] Room Number: 13-A Place of Service:  SNF (31) Provider:   Patient Care Team: Yvonna Alanis, NP as PCP - General (Adult Health Nurse Practitioner) Burnard Bunting, MD (Internal Medicine)  Extended Emergency Contact Information Primary Emergency Contact: Margo Aye Mobile Phone: [phone] Relation: Daughter Secondary Emergency Contact: Carilyn Goodpasture Address: [address] [place],  [place] Home Phone: [phone] Mobile Phone: [phone] Relation: Spouse  Code Status: FULL CODE Goals of Care: Advanced Directive information Advanced Directives 04/23/2021  Does Patient Have a Medical Advance Directive? Yes  Type of Paramedic of [place];Living will  Does patient want to make changes to medical advance directive? No - Patient declined  Copy of [place] in Chart? Yes - validated most recent copy scanned in chart (See row information)  Would patient like information on creating a medical advance directive? -     Chief Complaint  Patient presents with   Medicare Wellness    Annual Wellness Visit.   Health Maintenance    Discuss the need for Dexa Scan.   Immunizations    Discuss the need for tetanus vaccine, shingrix vaccine, pna vaccine, and influenza vaccine.     HPI: Patient is a 87 y.o. female seen in today for an annual wellness exam.    No flowsheet data found.  No flowsheet data found. No flowsheet data found.   Health Maintenance  Topic Date Due   TETANUS/TDAP  Never done   Zoster Vaccines- Shingrix (1 of 2) Never done   DEXA SCAN  Never done   PNA vac Low Risk Adult (1 of 2 - PCV13) Never done   INFLUENZA VACCINE  03/24/2021   COVID-19 Vaccine  Completed   HPV VACCINES  Aged Out    Urinary incontinence? Functional Status Survey:   Exercise? Diet? No results found. Hearing:   Dentition: Pain:  Past Medical History:  Diagnosis  Date   Atrial tachycardia ([facility])    paroxysmal   Cancer ([place])    Lung--Status post right upper lobectomy   Cancer ([place]) 1985   uterine   Cancer ([place]) 1993   lung with right upper lobectomy   Cancer ([place]) 1994   colon   Colon cancer ([place])    Colectomy   Colon polyps 2002   Compression fracture    Esophageal stricture 2007   Grover's disease    Hiatal hernia 2007   Osteoporosis    Syncope and collapse    Pre-syncope   Uterine cancer ([place]) 1985   complete hysterectomy   Venous stasis    chronic   Vertigo     Past Surgical History:  Procedure Laterality Date   CATARACT EXTRACTION     COLECTOMY     esophageal dilitation     HEMORRHOID SURGERY     INNER EAR SURGERY     LOBECTOMY     Right   LOBECTOMY Right 1993   REPLACEMENT TOTAL KNEE Left 2000   TOTAL ABDOMINAL HYSTERECTOMY     TOTAL KNEE ARTHROPLASTY     VEIN LIGATION AND STRIPPING      Family History  Problem Relation Age of Onset   Asthma Father    Emphysema Brother    Emphysema Brother    Breast cancer Maternal Grandmother    Lung cancer Sister    Heart disease Mother     Social History   Socioeconomic History  Marital status: Married    Spouse name: Not on file   Number of children: 3   Years of education: Not on file   Highest education level: Not on file  Occupational History   Occupation: Retired  Tobacco Use   Smoking status: Never   Smokeless tobacco: Never  Vaping Use   Vaping Use: Never used  Substance and Sexual Activity   Alcohol use: No   Drug use: No   Sexual activity: Not on file  Other Topics Concern   Not on file  Social History Narrative   ** Merged History Encounter **       Social Determinants of Health   Financial Resource Strain: Not on file  Food Insecurity: Not on file  Transportation Needs: Not on file  Physical Activity: Not on file  Stress: Not on file  Social Connections: Not on file    reports that she has never smoked. She has never used smokeless  tobacco. She reports that she does not drink alcohol and does not use drugs.   Allergies  Allergen Reactions   Adhesive [Tape]    Celebrex [Celecoxib]    Merthiolate [Thimerosal]    Neomycin    Penicillins Hives   Penicillins     Outpatient Encounter Medications as of 04/23/2021  Medication Sig   aspirin 81 MG chewable tablet Chew 81 mg by mouth daily.   Cholecalciferol (VITAMIN D3) 1000 units CHEW Chew 1 tablet by mouth daily.   diclofenac Sodium (VOLTAREN) 1 % GEL Apply 1 application topically 3 (three) times daily.   gabapentin (NEURONTIN) 100 MG capsule Take 100 mg by mouth 2 (two) times daily.   lisinopril (PRINIVIL,ZESTRIL) 20 MG tablet Take 20 mg by mouth daily.   metoprolol succinate (TOPROL-XL) 25 MG 24 hr tablet Take 25 mg by mouth daily.   Multiple Vitamins-Minerals (CENTRUM MULTIGUMMIES) CHEW Chew 1 tablet by mouth daily.   No facility-administered encounter medications on file as of 04/23/2021.     Review of Systems:  Review of Systems  Physical Exam: Vitals:   04/23/21 0925  BP: (!) 148/81  Pulse: 80  Resp: (!) 22  Temp: (!) 97 F (36.1 C)  SpO2: 93%  Weight: 110 lb 9.6 oz (50.2 kg)  Height: 5' (1.524 m)   Body mass index is 21.6 kg/m. Physical Exam  Labs reviewed: Basic Metabolic Panel: Recent Labs    02/17/21 2102 02/17/21 2123  NA 134* 134*  K 4.7 4.7  CL 100 98  CO2 26  --   GLUCOSE 113* 113*  BUN 9 8  CREATININE 0.68 0.70  CALCIUM 8.4*  --    Liver Function Tests: Recent Labs    02/17/21 2102  AST 21  ALT 16  ALKPHOS 46  BILITOT 0.4  PROT 5.2*  ALBUMIN 3.0*   No results for input(s): LIPASE, AMYLASE in the last 8760 hours. No results for input(s): AMMONIA in the last 8760 hours. CBC: Recent Labs    02/17/21 2102 02/17/21 2123  WBC 8.0  --   NEUTROABS 6.7  --   HGB 12.8 13.3  HCT 38.2 39.0  MCV 96.0  --   PLT 197  --    Lipid Panel: No results for input(s): CHOL, HDL, LDLCALC, TRIG, CHOLHDL, LDLDIRECT in the last  8760 hours. No results found for: HGBA1C  Procedures: No results found.  Assessment/Plan There are no diagnoses linked to this encounter.   Labs/tests ordered:  * No order type specified * Next appt:  @  [facility]@

## 2021-04-23 NOTE — ED Notes (Signed)
[phone] pt daughter name is Ebony Hail and called wanted to inform us that her mother is heard of hearing and can read lips a little bit but the best way to communicate is witting things down. I explained to her she just got back to a room and that her nurse or the doctor will call with a update when possible.

## 2021-04-23 NOTE — ED Triage Notes (Signed)
Pt here via [facility] from [facility] for intermittent shob and generalized weakness over the past few weeks that got worse tonight. Pt states she felt like she was going to pass out if she got up to go to dinner. Staff reports swelling in lower extremities, dyspnea w/ exertion. 190/110, 70 HR, 92% RA, 20g LFA.

## 2021-04-23 NOTE — ED Provider Notes (Signed)
Emergency Medicine Provider Triage Evaluation Note  [facility] [name] , a 87 y.o. female  was evaluated in triage.  Pt complains of SOB.  States that she has a compression fx in her spine and it makes it hard to breathe and sleep.  She reports increased weakness and felt like she was going to pass out.  From [facility].  Review of Systems  Positive: SOB, LE swelling Negative: Fever, cough  Physical Exam  SpO2 92%  Gen:   Awake,  Resp:  Mildly increased WOB MSK:   Moves extremities without difficulty  Other:  Left ectropion  Medical Decision Making  Medically screening exam initiated at 10:55 PM.  Appropriate orders placed.  [name] was informed that the remainder of the evaluation will be completed by another provider, this initial triage assessment does not replace that evaluation, and the importance of remaining in the ED until their evaluation is complete.  SOB   Montine Circle, PA-C 04/23/21 2256    Isla Pence, MD 04/24/21 [phone]

## 2021-04-24 ENCOUNTER — Emergency Department (HOSPITAL_COMMUNITY): Payer: PPO

## 2021-04-24 ENCOUNTER — Inpatient Hospital Stay (HOSPITAL_COMMUNITY): Payer: PPO

## 2021-04-24 ENCOUNTER — Encounter (HOSPITAL_COMMUNITY): Payer: Self-pay | Admitting: Emergency Medicine

## 2021-04-24 DIAGNOSIS — H9193 Unspecified hearing loss, bilateral: Secondary | ICD-10-CM | POA: Diagnosis present

## 2021-04-24 DIAGNOSIS — Z8542 Personal history of malignant neoplasm of other parts of uterus: Secondary | ICD-10-CM | POA: Diagnosis not present

## 2021-04-24 DIAGNOSIS — I872 Venous insufficiency (chronic) (peripheral): Secondary | ICD-10-CM | POA: Diagnosis present

## 2021-04-24 DIAGNOSIS — Z9049 Acquired absence of other specified parts of digestive tract: Secondary | ICD-10-CM | POA: Diagnosis not present

## 2021-04-24 DIAGNOSIS — M810 Age-related osteoporosis without current pathological fracture: Secondary | ICD-10-CM | POA: Diagnosis present

## 2021-04-24 DIAGNOSIS — Z85038 Personal history of other malignant neoplasm of large intestine: Secondary | ICD-10-CM | POA: Diagnosis not present

## 2021-04-24 DIAGNOSIS — I480 Paroxysmal atrial fibrillation: Secondary | ICD-10-CM | POA: Diagnosis present

## 2021-04-24 DIAGNOSIS — Z902 Acquired absence of lung [part of]: Secondary | ICD-10-CM | POA: Diagnosis not present

## 2021-04-24 DIAGNOSIS — Z7982 Long term (current) use of aspirin: Secondary | ICD-10-CM | POA: Diagnosis not present

## 2021-04-24 DIAGNOSIS — J189 Pneumonia, unspecified organism: Secondary | ICD-10-CM | POA: Diagnosis present

## 2021-04-24 DIAGNOSIS — I5021 Acute systolic (congestive) heart failure: Secondary | ICD-10-CM

## 2021-04-24 DIAGNOSIS — I890 Lymphedema, not elsewhere classified: Secondary | ICD-10-CM | POA: Diagnosis present

## 2021-04-24 DIAGNOSIS — Z79899 Other long term (current) drug therapy: Secondary | ICD-10-CM | POA: Diagnosis not present

## 2021-04-24 DIAGNOSIS — I5022 Chronic systolic (congestive) heart failure: Secondary | ICD-10-CM | POA: Diagnosis present

## 2021-04-24 DIAGNOSIS — J90 Pleural effusion, not elsewhere classified: Secondary | ICD-10-CM | POA: Diagnosis present

## 2021-04-24 DIAGNOSIS — Z888 Allergy status to other drugs, medicaments and biological substances status: Secondary | ICD-10-CM | POA: Diagnosis not present

## 2021-04-24 DIAGNOSIS — Z7409 Other reduced mobility: Secondary | ICD-10-CM | POA: Diagnosis present

## 2021-04-24 DIAGNOSIS — Z85118 Personal history of other malignant neoplasm of bronchus and lung: Secondary | ICD-10-CM | POA: Diagnosis not present

## 2021-04-24 DIAGNOSIS — Z91048 Other nonmedicinal substance allergy status: Secondary | ICD-10-CM | POA: Diagnosis not present

## 2021-04-24 DIAGNOSIS — Z880 Allergy status to penicillin: Secondary | ICD-10-CM | POA: Diagnosis not present

## 2021-04-24 DIAGNOSIS — I110 Hypertensive heart disease with heart failure: Secondary | ICD-10-CM | POA: Diagnosis present

## 2021-04-24 DIAGNOSIS — J9601 Acute respiratory failure with hypoxia: Secondary | ICD-10-CM

## 2021-04-24 DIAGNOSIS — Z20822 Contact with and (suspected) exposure to covid-19: Secondary | ICD-10-CM | POA: Diagnosis present

## 2021-04-24 DIAGNOSIS — R680 Hypothermia, not associated with low environmental temperature: Secondary | ICD-10-CM | POA: Diagnosis present

## 2021-04-24 DIAGNOSIS — Z90710 Acquired absence of both cervix and uterus: Secondary | ICD-10-CM | POA: Diagnosis not present

## 2021-04-24 DIAGNOSIS — Z66 Do not resuscitate: Secondary | ICD-10-CM | POA: Diagnosis present

## 2021-04-24 LAB — CBC WITH DIFFERENTIAL/PLATELET
Abs Immature Granulocytes: 0.02 10*3/uL (ref 0.00–0.07)
Basophils Absolute: 0 10*3/uL (ref 0.0–0.1)
Basophils Relative: 0 %
Eosinophils Absolute: 0 10*3/uL (ref 0.0–0.5)
Eosinophils Relative: 0 %
HCT: 40.1 % (ref 36.0–46.0)
Hemoglobin: 13.5 g/dL (ref 12.0–15.0)
Immature Granulocytes: 0 %
Lymphocytes Relative: 14 %
Lymphs Abs: 0.8 10*3/uL (ref 0.7–4.0)
MCH: 31.7 pg (ref 26.0–34.0)
MCHC: 33.7 g/dL (ref 30.0–36.0)
MCV: 94.1 fL (ref 80.0–100.0)
Monocytes Absolute: 0.6 10*3/uL (ref 0.1–1.0)
Monocytes Relative: 11 %
Neutro Abs: 4.2 10*3/uL (ref 1.7–7.7)
Neutrophils Relative %: 75 %
Platelets: 149 10*3/uL — ABNORMAL LOW (ref 150–400)
RBC: 4.26 MIL/uL (ref 3.87–5.11)
RDW: 13.9 % (ref 11.5–15.5)
WBC: 5.7 10*3/uL (ref 4.0–10.5)
nRBC: 0 % (ref 0.0–0.2)

## 2021-04-24 LAB — BASIC METABOLIC PANEL
Anion gap: 5 (ref 5–15)
BUN: 7 mg/dL — ABNORMAL LOW (ref 8–23)
CO2: 34 mmol/L — ABNORMAL HIGH (ref 22–32)
Calcium: 8.9 mg/dL (ref 8.9–10.3)
Chloride: 98 mmol/L (ref 98–111)
Creatinine, Ser: 0.59 mg/dL (ref 0.44–1.00)
GFR, Estimated: >60 mL/min (ref >60)
Glucose, Bld: 69 mg/dL — ABNORMAL LOW (ref 70–99)
Potassium: 4.4 mmol/L (ref 3.5–5.1)
Sodium: 137 mmol/L (ref 135–145)

## 2021-04-24 LAB — BASIC METABOLIC PANEL WITH GFR
Anion gap: 9 (ref 5–15)
BUN: 6 mg/dL — ABNORMAL LOW (ref 8–23)
CO2: 31 mmol/L (ref 22–32)
Calcium: 8.4 mg/dL — ABNORMAL LOW (ref 8.9–10.3)
Chloride: 96 mmol/L — ABNORMAL LOW (ref 98–111)
Creatinine, Ser: 0.53 mg/dL (ref 0.44–1.00)
GFR, Estimated: >60 mL/min
Glucose, Bld: 86 mg/dL (ref 70–99)
Potassium: 3.7 mmol/L (ref 3.5–5.1)
Sodium: 136 mmol/L (ref 135–145)

## 2021-04-24 LAB — ECHOCARDIOGRAM COMPLETE
AR max vel: 1.34 cm2
AV Area VTI: 1.38 cm2
AV Area mean vel: 1.62 cm2
AV Mean grad: 7 mmHg
AV Peak grad: 12.5 mmHg
Ao pk vel: 1.77 m/s
Area-P 1/2: 2.56 cm2
Height: 60 in
S' Lateral: 2.6 cm
Single Plane A4C EF: 71 %
Weight: 1769.6 [oz_av]

## 2021-04-24 LAB — TROPONIN I (HIGH SENSITIVITY)
Troponin I (High Sensitivity): 8 ng/L (ref <18)
Troponin I (High Sensitivity): 9 ng/L (ref <18)

## 2021-04-24 LAB — HEPATIC FUNCTION PANEL
ALT: 45 U/L — ABNORMAL HIGH (ref 0–44)
AST: 26 U/L (ref 15–41)
Albumin: 3.1 g/dL — ABNORMAL LOW (ref 3.5–5.0)
Alkaline Phosphatase: 61 U/L (ref 38–126)
Bilirubin, Direct: 0.1 mg/dL (ref 0.0–0.2)
Indirect Bilirubin: 0.6 mg/dL (ref 0.3–0.9)
Total Bilirubin: 0.7 mg/dL (ref 0.3–1.2)
Total Protein: 5.5 g/dL — ABNORMAL LOW (ref 6.5–8.1)

## 2021-04-24 LAB — RESP PANEL BY RT-PCR (FLU A&B, COVID) ARPGX2
Influenza A by PCR: NEGATIVE
Influenza B by PCR: NEGATIVE
SARS Coronavirus 2 by RT PCR: NEGATIVE

## 2021-04-24 LAB — MAGNESIUM: Magnesium: 1.9 mg/dL (ref 1.7–2.4)

## 2021-04-24 LAB — TSH: TSH: 2.491 u[IU]/mL (ref 0.350–4.500)

## 2021-04-24 LAB — BRAIN NATRIURETIC PEPTIDE: B Natriuretic Peptide: 129.8 pg/mL — ABNORMAL HIGH (ref 0.0–100.0)

## 2021-04-24 LAB — CORTISOL: Cortisol, Plasma: 11 ug/dL

## 2021-04-24 MED ORDER — SODIUM CHLORIDE 0.9 % IV SOLN
1.0000 g | Freq: Once | INTRAVENOUS | Status: AC
  Administered 2021-04-24: 1 g via INTRAVENOUS
  Filled 2021-04-24: qty 10

## 2021-04-24 MED ORDER — ASPIRIN 81 MG PO CHEW
81.0000 mg | CHEWABLE_TABLET | Freq: Every day | ORAL | Status: DC
  Administered 2021-04-24 – 2021-04-26 (×3): 81 mg via ORAL
  Filled 2021-04-24 (×3): qty 1

## 2021-04-24 MED ORDER — FUROSEMIDE 10 MG/ML IJ SOLN: 20.0000 mg | Freq: Two times a day (BID) | INTRAMUSCULAR | Status: DC

## 2021-04-24 MED ORDER — ENOXAPARIN SODIUM 30 MG/0.3ML IJ SOSY
30.0000 mg | PREFILLED_SYRINGE | INTRAMUSCULAR | Status: DC
  Administered 2021-04-24 – 2021-04-26 (×3): 30 mg via SUBCUTANEOUS
  Filled 2021-04-24 (×3): qty 0.3

## 2021-04-24 MED ORDER — ADULT MULTIVITAMIN W/MINERALS CH
1.0000 | ORAL_TABLET | Freq: Every day | ORAL | Status: DC
  Administered 2021-04-24 – 2021-04-26 (×3): 1 via ORAL
  Filled 2021-04-24 (×3): qty 1

## 2021-04-24 MED ORDER — SODIUM CHLORIDE 0.9 % IV SOLN
2.0000 g | Freq: Every day | INTRAVENOUS | Status: DC
  Administered 2021-04-25 – 2021-04-26 (×2): 2 g via INTRAVENOUS
  Filled 2021-04-24 (×2): qty 20

## 2021-04-24 MED ORDER — METOPROLOL SUCCINATE ER 25 MG PO TB24
25.0000 mg | ORAL_TABLET | Freq: Every day | ORAL | Status: DC
  Administered 2021-04-24 – 2021-04-26 (×3): 25 mg via ORAL
  Filled 2021-04-24 (×3): qty 1

## 2021-04-24 MED ORDER — SODIUM CHLORIDE 0.9 % IV SOLN
500.0000 mg | Freq: Once | INTRAVENOUS | Status: AC
  Administered 2021-04-24: 500 mg via INTRAVENOUS
  Filled 2021-04-24: qty 500

## 2021-04-24 MED ORDER — VITAMIN D 25 MCG (1000 UNIT) PO TABS
1000.0000 [IU] | ORAL_TABLET | Freq: Every day | ORAL | Status: DC
  Administered 2021-04-24 – 2021-04-26 (×3): 1000 [IU] via ORAL
  Filled 2021-04-24 (×3): qty 1

## 2021-04-24 MED ORDER — IOHEXOL 350 MG/ML SOLN
50.0000 mL | Freq: Once | INTRAVENOUS | Status: AC | PRN
  Administered 2021-04-24: 50 mL via INTRAVENOUS

## 2021-04-24 MED ORDER — ACETAMINOPHEN 325 MG PO TABS: 650.0000 mg | ORAL_TABLET | Freq: Four times a day (QID) | ORAL | Status: DC | PRN

## 2021-04-24 MED ORDER — SODIUM CHLORIDE 0.9 % IV SOLN
500.0000 mg | INTRAVENOUS | Status: DC
  Administered 2021-04-25 – 2021-04-26 (×2): 500 mg via INTRAVENOUS
  Filled 2021-04-24 (×2): qty 500

## 2021-04-24 MED ORDER — GABAPENTIN 100 MG PO CAPS
100.0000 mg | ORAL_CAPSULE | Freq: Two times a day (BID) | ORAL | Status: DC
  Administered 2021-04-24 – 2021-04-26 (×5): 100 mg via ORAL
  Filled 2021-04-24 (×5): qty 1

## 2021-04-24 MED ORDER — ACETAMINOPHEN 650 MG RE SUPP: 650.0000 mg | Freq: Four times a day (QID) | RECTAL | Status: DC | PRN

## 2021-04-24 MED ORDER — FUROSEMIDE 10 MG/ML IJ SOLN
40.0000 mg | Freq: Once | INTRAMUSCULAR | Status: AC
  Administered 2021-04-24: 40 mg via INTRAVENOUS
  Filled 2021-04-24: qty 4

## 2021-04-24 MED ORDER — MELATONIN 3 MG PO TABS
3.0000 mg | ORAL_TABLET | Freq: Every day | ORAL | Status: DC
  Administered 2021-04-24 – 2021-04-25 (×2): 3 mg via ORAL
  Filled 2021-04-24 (×2): qty 1

## 2021-04-24 MED ORDER — LISINOPRIL 20 MG PO TABS
20.0000 mg | ORAL_TABLET | Freq: Every day | ORAL | Status: DC
  Administered 2021-04-24 – 2021-04-26 (×3): 20 mg via ORAL
  Filled 2021-04-24 (×3): qty 1

## 2021-04-24 NOTE — ED Notes (Signed)
Meal tray delivered to pt at this time.

## 2021-04-24 NOTE — ED Notes (Signed)
Pt was assisted out of bed to access pulse ox. Pt stood but was unable to hold herself up. Pt pulse ox remained at 95% while being assisted to stand. Pt was too weak to stand on her own. Pt was placed back into bed.

## 2021-04-24 NOTE — ED Notes (Signed)
Attempted to call report X 1.

## 2021-04-24 NOTE — Plan of Care (Signed)

## 2021-04-24 NOTE — ED Provider Notes (Signed)
[facility] EMERGENCY DEPARTMENT Provider Note   CSN: [id number] Arrival date & time: 04/23/21  2247     History Chief Complaint  Patient presents with   Shortness of Breath    [facility] [name] is a 87 y.o. female.  The history is provided by the patient.  Shortness of Breath Severity:  Severe Onset quality:  Gradual Duration: few days. Progression:  Worsening Chronicity:  New Context: not pollens   Context comment:  Also global weakness and hypoxia Relieved by:  Nothing Worsened by:  Nothing Ineffective treatments:  None tried Associated symptoms: chest pain   Associated symptoms: no diaphoresis, no rash and no vomiting   Risk factors: no obesity       Past Medical History:  Diagnosis Date   Atrial tachycardia (HCC)    paroxysmal   Cancer (HCC)    Lung--Status post right upper lobectomy   Cancer ([place]) 1985   uterine   Cancer ([place]) 1993   lung with right upper lobectomy   Cancer ([place]) 1994   colon   Colon cancer ([place])    Colectomy   Colon polyps 2002   Compression fracture    Esophageal stricture 2007   Grover's disease    Hiatal hernia 2007   Osteoporosis    Syncope and collapse    Pre-syncope   Uterine cancer ([place]) 1985   complete hysterectomy   Venous stasis    chronic   Vertigo     Patient Active Problem List   Diagnosis Date Noted   Left shoulder pain 04/14/2021   History of lung cancer 02/18/2021   Gait abnormality 01/14/2021   Vitamin D deficiency 01/14/2021   Frequent falls 01/13/2021   Memory impairment 01/13/2021   Bilateral hearing loss 01/13/2021   Age-related osteoporosis without current pathological fracture 01/13/2021   Chest pain, rule out acute myocardial infarction 08/08/2017   HTN (hypertension) 08/08/2017   PAF (paroxysmal atrial fibrillation) ([place]) 08/08/2017   Murmur, cardiac 08/08/2017   PAT (paroxysmal atrial tachycardia) ([place]) 05/20/2011    Past Surgical History:  Procedure Laterality  Date   CATARACT EXTRACTION     COLECTOMY     esophageal dilitation     HEMORRHOID SURGERY     INNER EAR SURGERY     LOBECTOMY     Right   LOBECTOMY Right 1993   REPLACEMENT TOTAL KNEE Left 2000   TOTAL ABDOMINAL HYSTERECTOMY     TOTAL KNEE ARTHROPLASTY     VEIN LIGATION AND STRIPPING       OB History   No obstetric history on file.     Family History  Problem Relation Age of Onset   Asthma Father    Emphysema Brother    Emphysema Brother    Breast cancer Maternal Grandmother    Lung cancer Sister    Heart disease Mother     Social History   Tobacco Use   Smoking status: Never   Smokeless tobacco: Never  Vaping Use   Vaping Use: Never used  Substance Use Topics   Alcohol use: No   Drug use: No    Home Medications Prior to Admission medications   Medication Sig Start Date End Date Taking? Authorizing Provider  aspirin 81 MG chewable tablet Chew 81 mg by mouth daily.    [provider]  Cholecalciferol (VITAMIN D3) 1000 units CHEW Chew 1 tablet by mouth daily.    [provider]  diclofenac Sodium (VOLTAREN) 1 % GEL Apply 1 application  topically 3 (three) times daily.    [provider]  gabapentin (NEURONTIN) 100 MG capsule Take 100 mg by mouth 2 (two) times daily.    [provider]  lisinopril (PRINIVIL,ZESTRIL) 20 MG tablet Take 20 mg by mouth daily.    [provider]  metoprolol succinate (TOPROL-XL) 25 MG 24 hr tablet Take 25 mg by mouth daily.    [provider]  Multiple Vitamins-Minerals (CENTRUM MULTIGUMMIES) CHEW Chew 1 tablet by mouth daily.    [provider]    Allergies    Adhesive [tape], Celebrex [celecoxib], Merthiolate [thimerosal], Neomycin, Penicillins, and Penicillins  Review of Systems   Review of Systems  Constitutional:  Negative for diaphoresis.  HENT:  Negative for facial swelling.   Eyes:  Negative for redness.  Respiratory:  Positive for shortness of breath.    Cardiovascular:  Positive for chest pain, palpitations and leg swelling.  Gastrointestinal:  Negative for vomiting.  Musculoskeletal:  Negative for neck stiffness.  Skin:  Negative for rash.  Neurological:  Negative for facial asymmetry.  Psychiatric/Behavioral:  Negative for agitation.   All other systems reviewed and are negative.  Physical Exam Updated Vital Signs BP (!) 196/92 (BP Location: Right Arm)   Pulse (!) 58   Resp 19   SpO2 97%   Physical Exam Vitals and nursing note reviewed.  Constitutional:      Appearance: Normal appearance.  HENT:     Head: Normocephalic and atraumatic.     Nose: Nose normal.  Eyes:     Conjunctiva/sclera: Conjunctivae normal.     Pupils: Pupils are equal, round, and reactive to light.  Cardiovascular:     Rate and Rhythm: Normal rate and regular rhythm.     Pulses: Normal pulses.     Heart sounds: Murmur heard.  Pulmonary:     Effort: Tachypnea present.     Breath sounds: Decreased air movement present. Rales present.  Abdominal:     General: Abdomen is flat. Bowel sounds are normal.     Palpations: Abdomen is soft.     Tenderness: There is no abdominal tenderness.  Musculoskeletal:        General: Normal range of motion.     Right lower leg: Edema present.     Left lower leg: Edema present.  Skin:    General: Skin is warm and dry.     Capillary Refill: Capillary refill takes less than 2 seconds.  Neurological:     Mental Status: She is alert.     Deep Tendon Reflexes: Reflexes normal.  Psychiatric:        Mood and Affect: Mood normal.        Behavior: Behavior normal.    ED Results / Procedures / Treatments   Labs (all labs ordered are listed, but only abnormal results are displayed) Results for orders placed or performed during the hospital encounter of 04/23/21  Resp Panel by RT-PCR (Flu A&B, Covid) Nasopharyngeal Swab   Specimen: Nasopharyngeal Swab; Nasopharyngeal(NP) swabs in vial transport medium  Result Value Ref  Range   SARS Coronavirus 2 by RT PCR NEGATIVE NEGATIVE   Influenza A by PCR NEGATIVE NEGATIVE   Influenza B by PCR NEGATIVE NEGATIVE  CBC with Differential/Platelet  Result Value Ref Range   WBC 6.6 4.0 - 10.5 K/uL   RBC 4.40 3.87 - 5.11 MIL/uL   Hemoglobin 13.8 12.0 - 15.0 g/dL   HCT 41.3 36.0 - 46.0 %   MCV 93.9 80.0 - 100.0 fL  MCH 31.4 26.0 - 34.0 pg   MCHC 33.4 30.0 - 36.0 g/dL   RDW 14.0 11.5 - 15.5 %   Platelets 158 150 - 400 K/uL   nRBC 0.0 0.0 - 0.2 %   Neutrophils Relative % 75 %   Neutro Abs 4.9 1.7 - 7.7 K/uL   Lymphocytes Relative 14 %   Lymphs Abs 0.9 0.7 - 4.0 K/uL   Monocytes Relative 9 %   Monocytes Absolute 0.6 0.1 - 1.0 K/uL   Eosinophils Relative 1 %   Eosinophils Absolute 0.0 0.0 - 0.5 K/uL   Basophils Relative 0 %   Basophils Absolute 0.0 0.0 - 0.1 K/uL   Immature Granulocytes 1 %   Abs Immature Granulocytes 0.03 0.00 - 0.07 K/uL  Basic metabolic panel  Result Value Ref Range   Sodium 137 135 - 145 mmol/L   Potassium 4.4 3.5 - 5.1 mmol/L   Chloride 98 98 - 111 mmol/L   CO2 34 (H) 22 - 32 mmol/L   Glucose, Bld 69 (L) 70 - 99 mg/dL   BUN 7 (L) 8 - 23 mg/dL   Creatinine, Ser 0.59 0.44 - 1.00 mg/dL   Calcium 8.9 8.9 - 10.3 mg/dL   GFR, Estimated >60 >60 mL/min   Anion gap 5 5 - 15  Brain natriuretic peptide  Result Value Ref Range   B Natriuretic Peptide 129.8 (H) 0.0 - 100.0 pg/mL  Troponin I (High Sensitivity)  Result Value Ref Range   Troponin I (High Sensitivity) 8 <18 ng/L   DG Chest 2 View  Result Date: 04/24/2021 CLINICAL DATA:  Shortness of breath and generalized weakness. EXAM: CHEST - 2 VIEW COMPARISON:  February 17, 2021 FINDINGS: The lungs are hyperinflated. Mild, diffuse, chronic appearing increased lung markings are seen. Mild atelectasis and/or infiltrate is noted within the retrocardiac region of the left lung base. Small bilateral pleural effusions are noted. No pneumothorax is identified. Stable moderate severity enlargement of the  cardiac silhouette is seen. Multiple chronic right-sided rib fractures are present. IMPRESSION: 1. Stable cardiomegaly with mild left basilar atelectasis and/or infiltrate. 2. Small bilateral pleural effusions. Electronically Signed   By: Virgina Norfolk M.D.   On: 04/24/2021 00:33   CT Angio Chest PE W and/or Wo Contrast  Result Date: 04/24/2021 CLINICAL DATA:  Chest pain and shortness of breath. EXAM: CT ANGIOGRAPHY CHEST WITH CONTRAST TECHNIQUE: Multidetector CT imaging of the chest was performed using the standard protocol during bolus administration of intravenous contrast. Multiplanar CT image reconstructions and MIPs were obtained to evaluate the vascular anatomy. CONTRAST:  31mL OMNIPAQUE IOHEXOL 350 MG/ML SOLN COMPARISON:  Chest CT dated 08/08/2017. Chest radiograph dated 04/24/2021. FINDINGS: Cardiovascular: There is moderate cardiomegaly. No pericardial effusion. There is moderate atherosclerotic calcification of the thoracic aorta. No aneurysmal dilatation. There is dilatation of the main pulmonary trunk suggestive of pulmonary hypertension. No pulmonary artery emboli identified. Mediastinum/Nodes: No hilar or mediastinal adenopathy. The esophagus is grossly unremarkable. No mediastinal fluid collection. Lungs/Pleura: Small bilateral pleural effusions with bibasilar compressive atelectasis. Patchy area of consolidation at the left lung base may represent atelectasis or pneumonia. No pneumothorax. The central airways are patent. Upper Abdomen: No acute abnormality. Musculoskeletal: Severe osteopenia and degenerative changes of the spine. Old fracture of the sternum. There is old compression fracture of T9 similar to prior CT with associated kyphosis. No acute osseous pathology. Review of the MIP images confirms the above findings. IMPRESSION: 1. No CT evidence of pulmonary embolism. 2. Small bilateral pleural effusions with  bibasilar compressive atelectasis. Patchy area of consolidation at the left  lung base may represent atelectasis or pneumonia. 3. Moderate cardiomegaly. 4. Dilatation of the main pulmonary trunk suggestive of pulmonary hypertension. 5. Aortic Atherosclerosis (ICD10-I70.0). Electronically Signed   By: Anner Crete M.D.   On: 04/24/2021 01:31     EKG EKG Interpretation  Date/Time:  Wednesday April 23 2021 22:49:23 EDT Ventricular Rate:  78 PR Interval:  158 QRS Duration: 86 QT Interval:  408 QTC Calculation: 465 R Axis:   69 Text Interpretation: Normal sinus rhythm Confirmed by Randal Buba, Jaylan Duggar ([id number]) on 04/23/2021 11:47:24 PM  Radiology DG Chest 2 View  Result Date: 04/24/2021 CLINICAL DATA:  Shortness of breath and generalized weakness. EXAM: CHEST - 2 VIEW COMPARISON:  February 17, 2021 FINDINGS: The lungs are hyperinflated. Mild, diffuse, chronic appearing increased lung markings are seen. Mild atelectasis and/or infiltrate is noted within the retrocardiac region of the left lung base. Small bilateral pleural effusions are noted. No pneumothorax is identified. Stable moderate severity enlargement of the cardiac silhouette is seen. Multiple chronic right-sided rib fractures are present. IMPRESSION: 1. Stable cardiomegaly with mild left basilar atelectasis and/or infiltrate. 2. Small bilateral pleural effusions. Electronically Signed   By: Virgina Norfolk M.D.   On: 04/24/2021 00:33   CT Angio Chest PE W and/or Wo Contrast  Result Date: 04/24/2021 CLINICAL DATA:  Chest pain and shortness of breath. EXAM: CT ANGIOGRAPHY CHEST WITH CONTRAST TECHNIQUE: Multidetector CT imaging of the chest was performed using the standard protocol during bolus administration of intravenous contrast. Multiplanar CT image reconstructions and MIPs were obtained to evaluate the vascular anatomy. CONTRAST:  27mL OMNIPAQUE IOHEXOL 350 MG/ML SOLN COMPARISON:  Chest CT dated 08/08/2017. Chest radiograph dated 04/24/2021. FINDINGS: Cardiovascular: There is moderate cardiomegaly. No pericardial  effusion. There is moderate atherosclerotic calcification of the thoracic aorta. No aneurysmal dilatation. There is dilatation of the main pulmonary trunk suggestive of pulmonary hypertension. No pulmonary artery emboli identified. Mediastinum/Nodes: No hilar or mediastinal adenopathy. The esophagus is grossly unremarkable. No mediastinal fluid collection. Lungs/Pleura: Small bilateral pleural effusions with bibasilar compressive atelectasis. Patchy area of consolidation at the left lung base may represent atelectasis or pneumonia. No pneumothorax. The central airways are patent. Upper Abdomen: No acute abnormality. Musculoskeletal: Severe osteopenia and degenerative changes of the spine. Old fracture of the sternum. There is old compression fracture of T9 similar to prior CT with associated kyphosis. No acute osseous pathology. Review of the MIP images confirms the above findings. IMPRESSION: 1. No CT evidence of pulmonary embolism. 2. Small bilateral pleural effusions with bibasilar compressive atelectasis. Patchy area of consolidation at the left lung base may represent atelectasis or pneumonia. 3. Moderate cardiomegaly. 4. Dilatation of the main pulmonary trunk suggestive of pulmonary hypertension. 5. Aortic Atherosclerosis (ICD10-I70.0). Electronically Signed   By: Anner Crete M.D.   On: 04/24/2021 01:31    Procedures Procedures   Medications Ordered in ED Medications  aspirin chewable tablet 81 mg (has no administration in time range)  lisinopril (ZESTRIL) tablet 20 mg (has no administration in time range)  metoprolol succinate (TOPROL-XL) 24 hr tablet 25 mg (has no administration in time range)  gabapentin (NEURONTIN) capsule 100 mg (has no administration in time range)  cholecalciferol (VITAMIN D3) tablet 1,000 Units (has no administration in time range)  multivitamin with minerals tablet 1 tablet (has no administration in time range)  enoxaparin (LOVENOX) injection 30 mg (30 mg  Subcutaneous Given 04/24/21 0622)  cefTRIAXone (ROCEPHIN) 2 g in sodium chloride  0.9 % 100 mL IVPB (has no administration in time range)  azithromycin (ZITHROMAX) 500 mg in sodium chloride 0.9 % 250 mL IVPB (has no administration in time range)  acetaminophen (TYLENOL) tablet 650 mg (has no administration in time range)    Or  acetaminophen (TYLENOL) suppository 650 mg (has no administration in time range)  furosemide (LASIX) injection 20 mg (has no administration in time range)  iohexol (OMNIPAQUE) 350 MG/ML injection 50 mL (50 mLs Intravenous Contrast Given 04/24/21 0123)  cefTRIAXone (ROCEPHIN) 1 g in sodium chloride 0.9 % 100 mL IVPB (0 g Intravenous Stopped 04/24/21 0353)  azithromycin (ZITHROMAX) 500 mg in sodium chloride 0.9 % 250 mL IVPB (0 mg Intravenous Stopped 04/24/21 0504)  furosemide (LASIX) injection 40 mg (40 mg Intravenous Given 04/24/21 0353)     ED Course  I have reviewed the triage vital signs and the nursing notes.  Pertinent labs & imaging results that were available during my care of the patient were reviewed by me and considered in my medical decision making (see chart for details).   [name] was evaluated in Emergency Department on 04/24/2021 for the symptoms described in the history of present illness. She was evaluated in the context of the global COVID-19 pandemic, which necessitated consideration that the patient might be at risk for infection with the SARS-CoV-2 virus that causes COVID-19. Institutional protocols and algorithms that pertain to the evaluation of patients at risk for COVID-19 are in a state of rapid change based on information released by regulatory bodies including the CDC and federal and state organizations. These policies and algorithms were followed during the patient's care in the ED.  Final Clinical Impression(s) / ED Diagnoses Final diagnoses:  None   Admit to triad       Shyheem Whitham, MD 04/24/21 [id number]

## 2021-04-24 NOTE — Plan of Care (Signed)
  Problem: Activity: Goal: Risk for activity intolerance will decrease Outcome: Progressing   Problem: Elimination: Goal: Will not experience complications related to bowel motility Outcome: Progressing   Problem: Safety: Goal: Ability to remain free from injury will improve Outcome: Progressing   Problem: Skin Integrity: Goal: Risk for impaired skin integrity will decrease Outcome: Progressing

## 2021-04-24 NOTE — ED Notes (Signed)
Pt moved onto hospital bed for patient comfort. Pt tolerated well.

## 2021-04-24 NOTE — H&P (Addendum)
History and Physical    [name] ZOX:[id number] DOB: 10-22-1923 DOA: 04/23/2021  PCP: Yvonna Alanis, NP  Patient coming from: Skilled nursing facility.  Chief Complaint: Shortness of breath.  HPI: [name] is a 87 y.o. female with history of hypertension and paroxysmal A. fib.  And diastolic dysfunction per 2D echo done in 2018 and was brought to the ER after patient was getting more short of breath.  Patient is a poor historian and is not able to hold long she has been short of breath but she states that she has been having shortness of breath but no chest pain denies any productive cough fever or chills.  ED Course: In the ER patient was hypoxic requiring 2 L oxygen to maintain sats and patient had CT angiogram of the chest which was negative for pulmonary embolism but did show bilateral pleural effusion and features concerning for possible pneumonia.  Patient's labs show BNP of 129 EKG was normal sinus rhythm high sensitive troponins were negative.  Patient was given Lasix 40 mg IV and empiric antibiotic started for pneumonia admitted for further work-up.  COVID test was negative.  Review of Systems: As per HPI, rest all negative.   Past Medical History:  Diagnosis Date   Atrial tachycardia (HCC)    paroxysmal   Cancer ([place])    Lung--Status post right upper lobectomy   Cancer ([place]) 1985   uterine   Cancer ([place]) 1993   lung with right upper lobectomy   Cancer ([place]) 1994   colon   Colon cancer ([place])    Colectomy   Colon polyps 2002   Compression fracture    Esophageal stricture 2007   Grover's disease    Hiatal hernia 2007   Osteoporosis    Syncope and collapse    Pre-syncope   Uterine cancer ([place]) 1985   complete hysterectomy   Venous stasis    chronic   Vertigo     Past Surgical History:  Procedure Laterality Date   CATARACT EXTRACTION     COLECTOMY     esophageal dilitation     HEMORRHOID SURGERY     INNER EAR SURGERY      LOBECTOMY     Right   LOBECTOMY Right 1993   REPLACEMENT TOTAL KNEE Left 2000   TOTAL ABDOMINAL HYSTERECTOMY     TOTAL KNEE ARTHROPLASTY     VEIN LIGATION AND STRIPPING       reports that she has never smoked. She has never used smokeless tobacco. She reports that she does not drink alcohol and does not use drugs.  Allergies  Allergen Reactions   Adhesive [Tape]    Celebrex [Celecoxib]    Merthiolate [Thimerosal]    Neomycin    Penicillins Hives   Penicillins     Family History  Problem Relation Age of Onset   Asthma Father    Emphysema Brother    Emphysema Brother    Breast cancer Maternal Grandmother    Lung cancer Sister    Heart disease Mother     Prior to Admission medications   Medication Sig Start Date End Date Taking? Authorizing Provider  aspirin 81 MG chewable tablet Chew 81 mg by mouth daily.   Yes [provider]  cholecalciferol (VITAMIN D) 25 MCG (1000 UNIT) tablet Chew 1 tablet by mouth daily.   Yes [provider]  gabapentin (NEURONTIN) 100 MG capsule Take 100 mg by mouth 2 (two) times daily.  Yes [provider]  lisinopril (PRINIVIL,ZESTRIL) 20 MG tablet Take 20 mg by mouth daily.   Yes [provider]  metoprolol succinate (TOPROL-XL) 25 MG 24 hr tablet Take 25 mg by mouth daily.   Yes [provider]  Multiple Vitamins-Minerals (CENTRUM MULTIGUMMIES) CHEW Chew 2 tablets by mouth daily.   Yes [provider]  diclofenac Sodium (VOLTAREN) 1 % GEL Apply 1 application topically 3 (three) times daily.    [provider]    Physical Exam: Constitutional: Moderately built and nourished. Vitals:   04/23/21 2252 04/24/21 0009 04/24/21 0215 04/24/21 0245  BP:  (!) 196/92 (!) 166/134 (!) 175/74  Pulse:  (!) 58 81 (!) 58  Resp:  19 (!) 28 20  SpO2: 92% 97% 95% 94%   Eyes: Anicteric no pallor. ENMT: No discharge from the ears eyes nose and mouth. Neck: No mass felt.  No JVD  appreciated. Respiratory: No rhonchi or crepitations. Cardiovascular: S1-S2 heard. Abdomen: Soft nontender bowel sound present. Musculoskeletal: No edema. Skin: No rash. Neurologic: Alert awake oriented time place and person.  Moves all extremities. Psychiatric: Appears normal.  Normal affect.   Labs on Admission: I have personally reviewed following labs and imaging studies  CBC: Recent Labs  Lab 04/23/21 2303  WBC 6.6  NEUTROABS 4.9  HGB 13.8  HCT 41.3  MCV 93.9  PLT 161   Basic Metabolic Panel: Recent Labs  Lab 04/23/21 2303  NA 137  K 4.4  CL 98  CO2 34*  GLUCOSE 69*  BUN 7*  CREATININE 0.59  CALCIUM 8.9   GFR: Estimated Creatinine Clearance: 30.2 mL/min (by C-G formula based on SCr of 0.59 mg/dL). Liver Function Tests: No results for input(s): AST, ALT, ALKPHOS, BILITOT, PROT, ALBUMIN in the last 168 hours. No results for input(s): LIPASE, AMYLASE in the last 168 hours. No results for input(s): AMMONIA in the last 168 hours. Coagulation Profile: No results for input(s): INR, PROTIME in the last 168 hours. Cardiac Enzymes: No results for input(s): CKTOTAL, CKMB, CKMBINDEX, TROPONINI in the last 168 hours. BNP (last 3 results) No results for input(s): PROBNP in the last 8760 hours. HbA1C: No results for input(s): HGBA1C in the last 72 hours. CBG: No results for input(s): GLUCAP in the last 168 hours. Lipid Profile: No results for input(s): CHOL, HDL, LDLCALC, TRIG, CHOLHDL, LDLDIRECT in the last 72 hours. Thyroid Function Tests: No results for input(s): TSH, T4TOTAL, FREET4, T3FREE, THYROIDAB in the last 72 hours. Anemia Panel: No results for input(s): VITAMINB12, FOLATE, FERRITIN, TIBC, IRON, RETICCTPCT in the last 72 hours. Urine analysis:    Component Value Date/Time   COLORURINE YELLOW 02/17/2021 2102   APPEARANCEUR CLOUDY (A) 02/17/2021 2102   LABSPEC 1.011 02/17/2021 2102   PHURINE 8.0 02/17/2021 2102   GLUCOSEU NEGATIVE 02/17/2021 2102    HGBUR NEGATIVE 02/17/2021 2102   BILIRUBINUR NEGATIVE 02/17/2021 2102   KETONESUR NEGATIVE 02/17/2021 2102   PROTEINUR NEGATIVE 02/17/2021 2102   NITRITE NEGATIVE 02/17/2021 2102   LEUKOCYTESUR NEGATIVE 02/17/2021 2102   Sepsis Labs: @LABRCNTIP (procalcitonin:4,lacticidven:4) ) Recent Results (from the past 240 hour(s))  Resp Panel by RT-PCR (Flu A&B, Covid) Nasopharyngeal Swab     Status: None   Collection Time: 04/23/21 10:55 PM   Specimen: Nasopharyngeal Swab; Nasopharyngeal(NP) swabs in vial transport medium  Result Value Ref Range Status   SARS Coronavirus 2 by RT PCR NEGATIVE NEGATIVE Final    Comment: (NOTE) SARS-CoV-2 target nucleic acids are NOT DETECTED.  The SARS-CoV-2 RNA is generally  detectable in upper respiratory specimens during the acute phase of infection. The lowest concentration of SARS-CoV-2 viral copies this assay can detect is 138 copies/mL. A negative result does not preclude SARS-Cov-2 infection and should not be used as the sole basis for treatment or other patient management decisions. A negative result may occur with  improper specimen collection/handling, submission of specimen other than nasopharyngeal swab, presence of viral mutation(s) within the areas targeted by this assay, and inadequate number of viral copies(<138 copies/mL). A negative result must be combined with clinical observations, patient history, and epidemiological information. The expected result is Negative.  Fact Sheet for Patients:  EntrepreneurPulse.com.au  Fact Sheet for Healthcare Providers:  IncredibleEmployment.be  This test is no t yet approved or cleared by the [place] FDA and  has been authorized for detection and/or diagnosis of SARS-CoV-2 by FDA under an Emergency Use Authorization (EUA). This EUA will remain  in effect (meaning this test can be used) for the duration of the COVID-19 declaration under Section 564(b)(1) of the  Act, 21 U.S.C.section 360bbb-3(b)(1), unless the authorization is terminated  or revoked sooner.       Influenza A by PCR NEGATIVE NEGATIVE Final   Influenza B by PCR NEGATIVE NEGATIVE Final    Comment: (NOTE) The Xpert Xpress SARS-CoV-2/FLU/RSV plus assay is intended as an aid in the diagnosis of influenza from Nasopharyngeal swab specimens and should not be used as a sole basis for treatment. Nasal washings and aspirates are unacceptable for Xpert Xpress SARS-CoV-2/FLU/RSV testing.  Fact Sheet for Patients: EntrepreneurPulse.com.au  Fact Sheet for Healthcare Providers: IncredibleEmployment.be  This test is not yet approved or cleared by the [place] FDA and has been authorized for detection and/or diagnosis of SARS-CoV-2 by FDA under an Emergency Use Authorization (EUA). This EUA will remain in effect (meaning this test can be used) for the duration of the COVID-19 declaration under Section 564(b)(1) of the Act, 21 U.S.C. section 360bbb-3(b)(1), unless the authorization is terminated or revoked.  Performed at [facility], [place] [address], [place], [place] [address]      Radiological Exams on Admission: DG Chest 2 View  Result Date: 04/24/2021 CLINICAL DATA:  Shortness of breath and generalized weakness. EXAM: CHEST - 2 VIEW COMPARISON:  February 17, 2021 FINDINGS: The lungs are hyperinflated. Mild, diffuse, chronic appearing increased lung markings are seen. Mild atelectasis and/or infiltrate is noted within the retrocardiac region of the left lung base. Small bilateral pleural effusions are noted. No pneumothorax is identified. Stable moderate severity enlargement of the cardiac silhouette is seen. Multiple chronic right-sided rib fractures are present. IMPRESSION: 1. Stable cardiomegaly with mild left basilar atelectasis and/or infiltrate. 2. Small bilateral pleural effusions. Electronically Signed   By: Virgina Norfolk M.D.   On:  04/24/2021 00:33   CT Angio Chest PE W and/or Wo Contrast  Result Date: 04/24/2021 CLINICAL DATA:  Chest pain and shortness of breath. EXAM: CT ANGIOGRAPHY CHEST WITH CONTRAST TECHNIQUE: Multidetector CT imaging of the chest was performed using the standard protocol during bolus administration of intravenous contrast. Multiplanar CT image reconstructions and MIPs were obtained to evaluate the vascular anatomy. CONTRAST:  53mL OMNIPAQUE IOHEXOL 350 MG/ML SOLN COMPARISON:  Chest CT dated 08/08/2017. Chest radiograph dated 04/24/2021. FINDINGS: Cardiovascular: There is moderate cardiomegaly. No pericardial effusion. There is moderate atherosclerotic calcification of the thoracic aorta. No aneurysmal dilatation. There is dilatation of the main pulmonary trunk suggestive of pulmonary hypertension. No pulmonary artery emboli identified. Mediastinum/Nodes: No hilar or mediastinal  adenopathy. The esophagus is grossly unremarkable. No mediastinal fluid collection. Lungs/Pleura: Small bilateral pleural effusions with bibasilar compressive atelectasis. Patchy area of consolidation at the left lung base may represent atelectasis or pneumonia. No pneumothorax. The central airways are patent. Upper Abdomen: No acute abnormality. Musculoskeletal: Severe osteopenia and degenerative changes of the spine. Old fracture of the sternum. There is old compression fracture of T9 similar to prior CT with associated kyphosis. No acute osseous pathology. Review of the MIP images confirms the above findings. IMPRESSION: 1. No CT evidence of pulmonary embolism. 2. Small bilateral pleural effusions with bibasilar compressive atelectasis. Patchy area of consolidation at the left lung base may represent atelectasis or pneumonia. 3. Moderate cardiomegaly. 4. Dilatation of the main pulmonary trunk suggestive of pulmonary hypertension. 5. Aortic Atherosclerosis (ICD10-I70.0). Electronically Signed   By: Anner Crete M.D.   On: 04/24/2021 01:31     EKG: Independently reviewed.  Normal sinus rhythm with nonspecific changes.  Assessment/Plan Principal Problem:   Acute respiratory failure with hypoxia (HCC) Active Problems:   PAF (paroxysmal atrial fibrillation) (HCC)   CAP (community acquired pneumonia)    Acute respiratory failure with hypoxia presently on 2 L oxygen likely a combination of possible CHF and pneumonia for which patient is on Lasix and empiric antibiotics.  We will closely follow intake output metabolic panel daily weights and we will check 2D echo.  Last 2D echo done in 2018 showed EF of 65 to 70% with grade 1 diastolic dysfunction. Hypertension on beta-blockers and lisinopril. History of paroxysmal atrial fibrillation presently in sinus rhythm not on anticoagulation per report that was patient's request.  On aspirin.  Takes beta-blockers.  Since patient is acute respiratory failure requiring oxygen will need close monitoring and further work-up and inpatient status.  Addendum -patient is found hypothermic with temperature 91.4 F.  Patient will be placed on warming blankets will follow blood cultures THS cortisol levels.   DVT prophylaxis: Lovenox. Code Status: Full code. Family Communication: We will need to discuss with family. Disposition Plan: Back to facility when stable. Consults called: None. Admission status: Inpatient.   Rise Patience MD [facility] Pager [phone].  If 7PM-7AM, please contact night-coverage [email or web address] Password [email or web address]  04/24/2021, 5:04 AM

## 2021-04-24 NOTE — ED Notes (Signed)
Pt wasn't in room when lab came to collect labs.

## 2021-04-24 NOTE — ED Notes (Signed)
Attempted to call report X 2. 

## 2021-04-24 NOTE — Progress Notes (Signed)
PROGRESS NOTE  [name]  DOB: 06-30-1924  PCP: Yvonna Alanis, NP OJJ:[id number]  DOA: 04/23/2021  LOS: 0 days  Hospital Day: 2   Chief Complaint  Patient presents with   Shortness of Breath    Brief narrative: [name] is a 87 y.o. female with PMH significant for HTN, paroxysmal A. fib, diastolic dysfunction on echo. Patient was brought to the ED for shortness of breath without fever chills or productive cough..   In the ED, patient was afebrile, blood pressure elevated to 196/92, breathing comfortably room air. CBC unremarkable, BMP showed serum bicarb level elevated to 34 Troponin normal.  BNP elevated slightly to 130 CT angio of chest was negative for pulmonary embolism but it showed small bilateral pleural effusion and features concerning for possible pneumonia in the left lung base. Patient was given IV Lasix, empiric antibiotics and admitted to hospitalist service for further evaluation management.  Subjective: Patient was seen and examined this morning.  Pleasant elderly Caucasian female.  Alert, awake, hard of hearing.  On 2 L oxygen by nasal cannula. Daughter at bedside.  Patient lives in an assisted living facility and uses a walker for ambulation to the dining room. Chart reviewed. TSH 2.5  Assessment/Plan: Community-acquired pneumonia -Presented with shortness of breath. -CT chest with left lung base pneumonia -Currently on IV antibiotics. -Currently breathing on room air.  Check ambulatory oxygen saturation -Repeat echo. Recent Labs  Lab 04/23/21 2303 04/24/21 0617  WBC 6.6 5.7   Essential hypertension -Continue beta-blocker and lisinopril.  History of paroxysmal atrial fibrillation  -Currently in sinus rhythm on beta-blocker.  On aspirin.    Impaired mobility -PT eval ordered.  At baseline patient lives in an assisted living facility, able to walk with a walker.  Mobility: PT eval ordered Code Status:   Code Status: DNR.   Confirmed with patient's daughter at bedside and I updated the order. Nutritional status: There is no height or weight on file to calculate BMI.     Diet:  Diet Order             Diet Heart Room service appropriate? Yes; Fluid consistency: Thin; Fluid restriction: 1200 mL Fluid  Diet effective now                  DVT prophylaxis:  enoxaparin (LOVENOX) injection 30 mg Start: 04/24/21 0600   Antimicrobials: IV Rocephin, azithromycin Fluid: None Consultants: None Family Communication: Daughter Ms. Ebony Hail at bedside  Status is: Inpatient  Remains inpatient appropriate because: Needs IV antibiotics  Dispo: The patient is from: Assisted living facility              Anticipated d/c is to: Hopefully back to assisted living facility              Patient currently is not medically stable to d/c.   Difficult to place patient No     Infusions:   [START ON 04/25/2021] azithromycin     [START ON 04/25/2021] cefTRIAXone (ROCEPHIN)  IV      Scheduled Meds:  aspirin  81 mg Oral Daily   cholecalciferol  1,000 Units Oral Daily   enoxaparin (LOVENOX) injection  30 mg Subcutaneous Q24H   gabapentin  100 mg Oral BID   lisinopril  20 mg Oral Daily   melatonin  3 mg Oral QHS   metoprolol succinate  25 mg Oral Daily   multivitamin with minerals  1 tablet Oral Daily    Antimicrobials:  Anti-infectives (From admission, onward)    Start     Dose/Rate Route Frequency Ordered Stop   04/25/21 0600  cefTRIAXone (ROCEPHIN) 2 g in sodium chloride 0.9 % 100 mL IVPB        2 g 200 mL/hr over 30 Minutes Intravenous Daily 04/24/21 0503 04/30/21 0559   04/25/21 0600  azithromycin (ZITHROMAX) 500 mg in sodium chloride 0.9 % 250 mL IVPB        500 mg 250 mL/hr over 60 Minutes Intravenous Every 24 hours 04/24/21 0503 04/30/21 0559   04/24/21 0215  cefTRIAXone (ROCEPHIN) 1 g in sodium chloride 0.9 % 100 mL IVPB        1 g 200 mL/hr over 30 Minutes Intravenous  Once 04/24/21 0204 04/24/21 0353    04/24/21 0215  azithromycin (ZITHROMAX) 500 mg in sodium chloride 0.9 % 250 mL IVPB        500 mg 250 mL/hr over 60 Minutes Intravenous  Once 04/24/21 0204 04/24/21 0504       PRN meds: acetaminophen **OR** acetaminophen   Objective: Vitals:   04/24/21 1200 04/24/21 1252  BP: 122/70 103/68  Pulse: 62 76  Resp: (!) 21 13  Temp:  (!) 95.7 F (35.4 C)  SpO2: 97% 97%    Intake/Output Summary (Last 24 hours) at 04/24/2021 1355 Last data filed at 04/24/2021 1884 Gross per 24 hour  Intake --  Output 1800 ml  Net -1800 ml   There were no vitals filed for this visit. Weight change:  There is no height or weight on file to calculate BMI.   Physical Exam: General exam: Pleasant, elderly Caucasian female.  Not in physical distress Skin: No rashes, lesions or ulcers. HEENT: Atraumatic, normocephalic, no obvious bleeding Lungs: Diminished air entry both bases, kyphotic back.  On 2 L oxygen by nasal cannula. CVS: Regular rate and rhythm, no murmur GI/Abd soft, nontender, nondistended, bowel sound present CNS: Alert, awake, hard of hearing Psychiatry: Mood appropriate Extremities: Bilateral puffy legs, and has chronic lymphedema.  No pitting.  Data Review: I have personally reviewed the laboratory data and studies available.  Recent Labs  Lab 04/23/21 2303 04/24/21 0617  WBC 6.6 5.7  NEUTROABS 4.9 4.2  HGB 13.8 13.5  HCT 41.3 40.1  MCV 93.9 94.1  PLT 158 149*   Recent Labs  Lab 04/23/21 2303 04/24/21 0617  NA 137 136  K 4.4 3.7  CL 98 96*  CO2 34* 31  GLUCOSE 69* 86  BUN 7* 6*  CREATININE 0.59 0.53  CALCIUM 8.9 8.4*  MG  --  1.9    F/u labs ordered Unresulted Labs (From admission, onward)     Start     Ordered   05/01/21 0500  Creatinine, serum  (enoxaparin (LOVENOX)    CrCl >/= 30 ml/min)  Weekly,   R     Comments: while on enoxaparin therapy    04/24/21 0503   04/24/21 0633  Culture, blood (routine x 2)  BLOOD CULTURE X 2,   R      04/24/21 0632    04/24/21 0501  CBC  (enoxaparin (LOVENOX)    CrCl >/= 30 ml/min)  Once,   STAT       Comments: Baseline for enoxaparin therapy IF NOT ALREADY DRAWN.  Notify MD if PLT < 100 K.    04/24/21 0503            Signed, Terrilee Croak, MD [facility] 04/24/2021

## 2021-04-24 NOTE — ED Notes (Signed)
No temperature previously noted in patient's chart. Rectal temperature reading 91.4. Bair hugger applied, Dr. Hal Hope notified.

## 2021-04-24 NOTE — ED Notes (Signed)
Patient's daughter, Ebony Hail, would like it noted in chart that patient is very intelligent with no memory impairment. However, patient is extremely hard of hearing, even with hearing aid in place. Asks that staff use pen and paper to communicate successfully with patient during her hospital stay.

## 2021-04-25 NOTE — Progress Notes (Signed)
PROGRESS NOTE  [name]  DOB: 1924-07-27  PCP: Yvonna Alanis, NP OAC:[id number]  DOA: 04/23/2021  LOS: 1 day  Hospital Day: 3   Chief Complaint  Patient presents with   Shortness of Breath    Brief narrative: [name] is a 87 y.o. female with PMH significant for HTN, paroxysmal A. fib, diastolic dysfunction on echo. Patient was brought to the ED for shortness of breath without fever chills or productive cough..   In the ED, patient was afebrile, blood pressure elevated to 196/92, breathing comfortably room air. CBC unremarkable, BMP showed serum bicarb level elevated to 34 Troponin normal.  BNP elevated slightly to 130 CT angio of chest was negative for pulmonary embolism but it showed small bilateral pleural effusion and features concerning for possible pneumonia in the left lung base. Patient was given IV Lasix, empiric antibiotics and admitted to hospitalist service for further evaluation management.  Subjective: Patient was seen and examined this morning.   Pleasant elderly Caucasian female.  Propped up in bed.  Not in distress.  On 2 L oxygen by nasal cannula. Seen by PT.  SNF recommended.  Assessment/Plan: Community-acquired pneumonia Acute respiratory failure with hypoxia -Presented with shortness of breath. -CT chest with left lung base pneumonia -Currently on IV Rocephin and IV azithromycin.. -Currently on 2 L oxygen nasal cannula.  Does not use oxygen at baseline. Recent Labs  Lab 04/23/21 2303 04/24/21 0617  WBC 6.6 5.7  Essential hypertension -Continue beta-blocker and lisinopril.  Bilateral pedal edema -Bilateral trace pedal edema noted -Echocardiogram with EF 60-65 %, severe LVH -Can consider low-dose Lasix as needed at discharge.  History of paroxysmal atrial fibrillation  -Currently in sinus rhythm on beta-blocker. On aspirin.    Impaired mobility -At baseline patient lives in an assisted living facility, able to walk  with a walker. -PT recommended SNF.  Mobility: PT eval appreciated Code Status:   Code Status: DNR.   Nutritional status: Body mass index is 21.6 kg/m.     Diet:  Diet Order             Diet Heart Room service appropriate? Yes; Fluid consistency: Thin; Fluid restriction: 1200 mL Fluid  Diet effective now                  DVT prophylaxis:  enoxaparin (LOVENOX) injection 30 mg Start: 04/24/21 0600   Antimicrobials: IV Rocephin, azithromycin Fluid: None Consultants: None Family Communication: Daughter Ms. Ebony Hail at bedside  Status is: Inpatient  Remains inpatient appropriate because: Pending SNF  Dispo: The patient is from: Assisted living facility              Anticipated d/c is to: SNF              Patient currently is not medically stable to d/c.   Difficult to place patient No     Infusions:   azithromycin Stopped (04/25/21 1438)   cefTRIAXone (ROCEPHIN)  IV Stopped (04/25/21 1353)    Scheduled Meds:  aspirin  81 mg Oral Daily   cholecalciferol  1,000 Units Oral Daily   enoxaparin (LOVENOX) injection  30 mg Subcutaneous Q24H   gabapentin  100 mg Oral BID   lisinopril  20 mg Oral Daily   melatonin  3 mg Oral QHS   metoprolol succinate  25 mg Oral Daily   multivitamin with minerals  1 tablet Oral Daily    Antimicrobials: Anti-infectives (From admission, onward)    Start  Dose/Rate Route Frequency Ordered Stop   04/25/21 0600  cefTRIAXone (ROCEPHIN) 2 g in sodium chloride 0.9 % 100 mL IVPB        2 g 200 mL/hr over 30 Minutes Intravenous Daily 04/24/21 0503 04/30/21 0559   04/25/21 0600  azithromycin (ZITHROMAX) 500 mg in sodium chloride 0.9 % 250 mL IVPB        500 mg 250 mL/hr over 60 Minutes Intravenous Every 24 hours 04/24/21 0503 04/30/21 0559   04/24/21 0215  cefTRIAXone (ROCEPHIN) 1 g in sodium chloride 0.9 % 100 mL IVPB        1 g 200 mL/hr over 30 Minutes Intravenous  Once 04/24/21 0204 04/24/21 0353   04/24/21 0215  azithromycin  (ZITHROMAX) 500 mg in sodium chloride 0.9 % 250 mL IVPB        500 mg 250 mL/hr over 60 Minutes Intravenous  Once 04/24/21 0204 04/24/21 0504       PRN meds: acetaminophen **OR** acetaminophen   Objective: Vitals:   04/25/21 0800 04/25/21 1159  BP: 126/64 121/71  Pulse: 62 62  Resp: 15 16  Temp: 98.3 F (36.8 C) 98.2 F (36.8 C)  SpO2: 98% 98%   No intake or output data in the 24 hours ending 04/25/21 1449  Filed Weights   04/24/21 1758  Weight: 50.2 kg   Weight change:  Body mass index is 21.6 kg/m.   Physical Exam: General exam: Pleasant, elderly Caucasian female.  Not in physical distress Skin: No rashes, lesions or ulcers. HEENT: Atraumatic, normocephalic, no obvious bleeding Lungs: Diminished air entry both bases, kyphotic back.  On 2 L oxygen by nasal cannula. CVS: Regular rate and rhythm, no murmur GI/Abd soft, nontender, nondistended, bowel sound present CNS: Alert, awake, hard of hearing Psychiatry: Mood appropriate Extremities: Bilateral puffy legs, and has chronic lymphedema.  No pitting.  Data Review: I have personally reviewed the laboratory data and studies available.  Recent Labs  Lab 04/23/21 2303 04/24/21 0617  WBC 6.6 5.7  NEUTROABS 4.9 4.2  HGB 13.8 13.5  HCT 41.3 40.1  MCV 93.9 94.1  PLT 158 149*   Recent Labs  Lab 04/23/21 2303 04/24/21 0617  NA 137 136  K 4.4 3.7  CL 98 96*  CO2 34* 31  GLUCOSE 69* 86  BUN 7* 6*  CREATININE 0.59 0.53  CALCIUM 8.9 8.4*  MG  --  1.9    F/u labs ordered Unresulted Labs (From admission, onward)     Start     Ordered   05/01/21 0500  Creatinine, serum  (enoxaparin (LOVENOX)    CrCl >/= 30 ml/min)  Weekly,   R     Comments: while on enoxaparin therapy    04/24/21 0503   04/26/21 0500  CBC with Differential/Platelet  Daily,   R     Question:  Specimen collection method  Answer:  Unit=Unit collect   04/25/21 1449   04/26/21 5027  Basic metabolic panel  Daily,   R     Question:  Specimen  collection method  Answer:  Unit=Unit collect   04/25/21 1449   04/25/21 1438  SARS CORONAVIRUS 2 (TAT 6-24 HRS) Nasopharyngeal Nasopharyngeal Swab  (Tier 3 - Symptomatic/asymptomatic)  Once,   R       Question Answer Comment  Is this test for diagnosis or screening Screening   Symptomatic for COVID-19 as defined by CDC No   Hospitalized for COVID-19 No   Admitted to ICU for COVID-19 No  Previously tested for COVID-19 Yes   Resident in a congregate (group) care setting Unknown   Employed in healthcare setting Unknown   Pregnant No   Has patient completed COVID vaccination(s) (2 doses of Pfizer/Moderna 1 dose of [employer]) Unknown      04/25/21 1437   04/24/21 0501  CBC  (enoxaparin (LOVENOX)    CrCl >/= 30 ml/min)  Once,   STAT       Comments: Baseline for enoxaparin therapy IF NOT ALREADY DRAWN.  Notify MD if PLT < 100 K.    04/24/21 0503            Signed, Terrilee Croak, MD [facility] 04/25/2021

## 2021-04-25 NOTE — Evaluation (Signed)
Physical Therapy Evaluation Patient Details Name: [name] MRN: [id number] DOB: [birth year] Today's Date: 04/25/2021   History of Present Illness  pt is a 87 y/o female admitted 8/31 with c/o SOB and found to have acute respirotory failure with hypoxia likely due to a combination of possible CHF and pneumonia.  PMHx: lung, uterine and colon CA's, compression fx's, osteoporosis  Clinical Impression  Pt admitted with/for SOB due likely as a combo of CHF and PNA.  Pt needing min to min gd assist.  Pt currently limited functionally due to the problems listed. ( See problems list.)   Will defer further acute therapy in lieu of starting therapy at the health care center at [facility].      Follow Up Recommendations SNF;Other (comment) ([facility] [place] [facility])    Equipment Recommendations  None recommended by PT    Recommendations for Other Services       Precautions / Restrictions Precautions Precautions: Fall      Mobility  Bed Mobility Overal bed mobility: Needs Assistance Bed Mobility: Supine to Sit     Supine to sit: Min guard     General bed mobility comments: pt transitioned to EOB from slightly raised HOB slowly, but with no assist    Transfers Overall transfer level: Needs assistance   Transfers: Sit to/from Stand Sit to Stand: Min guard;Min assist         General transfer comment: used UE's to stand appropriately, but did not reach back when sitting, consistently.  One up, she occasionally needed stability assist  Ambulation/Gait Ambulation/Gait assistance: Min assist Gait Distance (Feet): 35 Feet (x2) Assistive device: Rolling walker (2 wheeled) Gait Pattern/deviations: Step-through pattern;Decreased step length - right;Decreased step length - left;Decreased stride length   Gait velocity interpretation: <1.31 ft/sec, indicative of household ambulator General Gait Details: pt was mildly unsteady, chronically flexed posture,  short, low amplitude, if not shuffled steps.  Maintained SpO2 in mid 90's on RA, HR also upper 80's to mid 90's bpm during gait.  pt reported some exertion and SOB.  As she fatigued, pt had tendency to list posteriorly.  Stairs            Wheelchair Mobility    Modified Rankin (Stroke Patients Only)       Balance Overall balance assessment: Needs assistance Sitting-balance support: Single extremity supported;No upper extremity supported;Feet supported Sitting balance-Leahy Scale: Fair     Standing balance support: Bilateral upper extremity supported Standing balance-Leahy Scale: Poor Standing balance comment: reliant on UE assist on AD and occasional external support.                             Pertinent Vitals/Pain Pain Assessment: Faces Faces Pain Scale: No hurt Pain Intervention(s): Monitored during session    Home Living Family/patient expects to be discharged to:: Assisted living   Available Help at Discharge: [place]: One level Home Equipment: Environmental consultant - 4 wheels      Prior Function Level of Independence: Independent with assistive device(s) (in the supervise A Living setting)               Hand Dominance        Extremity/Trunk Assessment   Upper Extremity Assessment Upper Extremity Assessment: Overall WFL for tasks assessed    Lower Extremity Assessment Lower Extremity Assessment: Overall WFL for tasks assessed;Generalized weakness  Cervical / Trunk Assessment Cervical / Trunk Assessment: Kyphotic  Communication   Communication: HOH;Deaf  Cognition Arousal/Alertness: Awake/alert Behavior During Therapy: WFL for tasks assessed/performed Overall Cognitive Status: Difficult to assess (When she hears well enough, pt follows direction well.)                                        General Comments General comments (skin integrity, edema, etc.): When asked pt stated, I think  I should go to the 'health care' (area) for a little bit.    Exercises     Assessment/Plan    PT Assessment All further PT needs can be met in the next venue of care  PT Problem List Decreased strength;Decreased activity tolerance;Decreased balance;Decreased mobility;Decreased knowledge of use of DME;Cardiopulmonary status limiting activity       PT Treatment Interventions      PT Goals (Current goals can be found in the Care Plan section)  Acute Rehab PT Goals Patient Stated Goal: I want her to go where she needs to go. PT Goal Formulation: All assessment and education complete, DC therapy    Frequency     Barriers to discharge        Co-evaluation               AM-PAC PT "6 Clicks" Mobility  Outcome Measure Help needed turning from your back to your side while in a flat bed without using bedrails?: A Little Help needed moving from lying on your back to sitting on the side of a flat bed without using bedrails?: A Little Help needed moving to and from a bed to a chair (including a wheelchair)?: A Little Help needed standing up from a chair using your arms (e.g., wheelchair or bedside chair)?: A Little Help needed to walk in hospital room?: A Little Help needed climbing 3-5 steps with a railing? : A Lot 6 Click Score: 17    End of Session Equipment Utilized During Treatment: Oxygen Activity Tolerance: Patient tolerated treatment well;Patient limited by fatigue Patient left: in chair;with call bell/phone within reach;with family/visitor present Nurse Communication: Mobility status PT Visit Diagnosis: Unsteadiness on feet (R26.81);Other abnormalities of gait and mobility (R26.89);Muscle weakness (generalized) (M62.81);Difficulty in walking, not elsewhere classified (R26.2)    Time: 0109-3235 PT Time Calculation (min) (ACUTE ONLY): 53 min   Charges:   PT Evaluation $PT Eval Moderate Complexity: 1 Mod PT Treatments $Gait Training: 8-22 mins $Therapeutic Activity:  8-22 mins        04/25/2021  Ginger Carne., PT Acute Rehabilitation Services [phone]  (pager) [phone]  (office)  Tessie Fass Wilena Tyndall 04/25/2021, 1:48 PM

## 2021-04-25 NOTE — Plan of Care (Signed)

## 2021-04-25 NOTE — TOC Initial Note (Addendum)
Transition of Care [facility]) - Initial/Assessment Note    Patient Details  Name: [name] MRN: [id number] Date of Birth: 1924/08/24  Transition of Care [facility]) CM/SW Contact:    Joanne Chars, LCSW Phone Number: 04/25/2021, 2:14 PM  Clinical Narrative:  CSW met with pt, daughter Ebony Hail, son in law [place] regarding recommendation for SNF.  Pt quite hard of hearing, CSW introduced self and attempted conversation.  Pt aware of her situation, but all information came from pt daughter.  Pt is from [facility] ALF.  Daughter aware of recommendation for rehab at DC and says that pt agreed to this when brought up by PT.  Daughter hoping pt could transfer back to [facility] today, discussed need for insurance auth and that this is unlikely.  CSW will initiate the process and update daughter later in the day.    1300: CSW spoke to [facility] who confirmed pt can return to rehab wing at [facility], need covid and insurance auth.   1400: SNF auth request made to [place] at [place].  1500: Per Otilio Carpen at [facility], once [place] received (on the weekend) all paperwork needs to be faxed to [phone].  Please call nursing office at [phone] x [phone] to arrange pt to return.       1610: [facility] approves SNF, auth [id number]. Also approves ambulance, Josem Kaufmann [phone].  [facility] informed but cannot take pt until   tomorrow.  MD informed.                Expected Discharge Plan: Skilled Nursing Facility Barriers to Discharge: Insurance Authorization   Patient Goals and CMS Choice   CMS Medicare.gov Compare Post Acute Care list provided to::  (NA: pt/family want return to [facility])    Expected Discharge Plan and Services Expected Discharge Plan: [place] Choice: [place] arrangements for the past 2 months: [place]                                      Prior Living  Arrangements/Services Living arrangements for the past 2 months: [place] Lives with:: Facility Resident Patient language and need for interpreter reviewed:: Yes        Need for Family Participation in Patient Care: Yes (Comment) Care giver support system in place?: Yes (comment) Current home services: Other (comment) (NA) Criminal Activity/Legal Involvement Pertinent to Current Situation/Hospitalization: No - Comment as needed  Activities of Daily Living Home Assistive Devices/Equipment: Bedside commode/3-in-1 ADL Screening (condition at time of admission) Patient's cognitive ability adequate to safely complete daily activities?: Yes Is the patient deaf or have difficulty hearing?: Yes Does the patient have difficulty seeing, even when wearing glasses/contacts?: No Does the patient have difficulty concentrating, remembering, or making decisions?: Yes Patient able to express need for assistance with ADLs?: Yes Does the patient have difficulty dressing or bathing?: Yes Independently performs ADLs?: No Communication: Independent Dressing (OT): Needs assistance Is this a change from baseline?: Pre-admission baseline Grooming: Needs assistance Is this a change from baseline?: Pre-admission baseline Feeding: Needs assistance Is this a change from baseline?: Pre-admission baseline Bathing: Needs assistance Is this a change from baseline?: Pre-admission baseline Toileting: Needs assistance Is this a change from baseline?: Pre-admission baseline In/Out Bed: Needs assistance Is this a change from baseline?: Pre-admission baseline Walks in Home:  Needs assistance Is this a change from baseline?: Pre-admission baseline Does the patient have difficulty walking or climbing stairs?: No Weakness of Legs: Both Weakness of Arms/Hands: Both  Permission Sought/Granted                  Emotional Assessment Appearance:: Appears stated age Attitude/Demeanor/Rapport: Unable  to Assess Affect (typically observed): Unable to Assess Orientation: : Oriented to Self, Oriented to Place, Oriented to  Time, Oriented to Situation Alcohol / Substance Use: Not Applicable Psych Involvement: No (comment)  Admission diagnosis:  Pleural effusion [J90] Hypoxia [R09.02] CAP (community acquired pneumonia) [J18.9] Acute respiratory failure with hypoxia ([place]) [J96.01] Community acquired pneumonia of left lung, unspecified part of lung [J18.9] Patient Active Problem List   Diagnosis Date Noted   CAP (community acquired pneumonia) 04/24/2021   Acute respiratory failure with hypoxia ([place]) 04/24/2021   Left shoulder pain 04/14/2021   History of lung cancer 02/18/2021   Gait abnormality 01/14/2021   Vitamin D deficiency 01/14/2021   Frequent falls 01/13/2021   Memory impairment 01/13/2021   Bilateral hearing loss 01/13/2021   Age-related osteoporosis without current pathological fracture 01/13/2021   Chest pain, rule out acute myocardial infarction 08/08/2017   HTN (hypertension) 08/08/2017   PAF (paroxysmal atrial fibrillation) ([place]) 08/08/2017   Murmur, cardiac 08/08/2017   PAT (paroxysmal atrial tachycardia) ([place]) 05/20/2011   PCP:  Yvonna Alanis, NP Pharmacy:   [id number]: [phone]: [phone]    Social Determinants of Health (SDOH) Interventions    Readmission Risk Interventions No flowsheet data found.

## 2021-04-25 NOTE — NC FL2 (Signed)
[place] LEVEL OF CARE SCREENING TOOL     IDENTIFICATION  Patient Name: [name] Birthdate: 06-02-1924 Sex: female Admission Date (Current Location): 04/23/2021  [facility] and [place] Number:  Herbalist and Address:  [facility], [place] [address], [place], [place] [address]      Provider Number: [id number]  Attending Physician Name and Address:  Terrilee Croak, MD  Relative Name and Phone Number:  Margo Aye Daughter   [phone]    Current Level of Care: Hospital Recommended Level of Care: [place] Prior Approval Number:    Date Approved/Denied:   PASRR Number: [id number]  Discharge Plan: SNF    Current Diagnoses: Patient Active Problem List   Diagnosis Date Noted   CAP (community acquired pneumonia) 04/24/2021   Acute respiratory failure with hypoxia ([place]) 04/24/2021   Left shoulder pain 04/14/2021   History of lung cancer 02/18/2021   Gait abnormality 01/14/2021   Vitamin D deficiency 01/14/2021   Frequent falls 01/13/2021   Memory impairment 01/13/2021   Bilateral hearing loss 01/13/2021   Age-related osteoporosis without current pathological fracture 01/13/2021   Chest pain, rule out acute myocardial infarction 08/08/2017   HTN (hypertension) 08/08/2017   PAF (paroxysmal atrial fibrillation) ([place]) 08/08/2017   Murmur, cardiac 08/08/2017   PAT (paroxysmal atrial tachycardia) ([place]) 05/20/2011    Orientation RESPIRATION BLADDER Height & Weight     Self, Time, Situation, Place  O2 External catheter Weight: 110 lb 9.6 oz (50.2 kg) Height:  5' (152.4 cm)  BEHAVIORAL SYMPTOMS/MOOD NEUROLOGICAL BOWEL NUTRITION STATUS      Continent Diet (see discharge summary)  AMBULATORY STATUS COMMUNICATION OF NEEDS Skin   Limited Assist Verbally Normal                       Personal Care Assistance Level of Assistance  Bathing, Feeding, Dressing Bathing Assistance: Limited  assistance Feeding assistance: Independent Dressing Assistance: Limited assistance     Functional Limitations Info  Sight, Hearing, Speech Sight Info: Adequate Hearing Info: Impaired Speech Info: Adequate    SPECIAL CARE FACTORS FREQUENCY  PT (By licensed PT)     PT Frequency: 5x week              Contractures Contractures Info: Not present    Additional Factors Info  Code Status, Allergies Code Status Info: DNR Allergies Info: Adhesive (Tape), Celebrex (Celecoxib), Merthiolate (Thimerosal), Neomycin, Penicillins, Penicillins           Current Medications (04/25/2021):  This is the current hospital active medication list Current Facility-Administered Medications  Medication Dose Route Frequency Provider Last Rate Last Admin   acetaminophen (TYLENOL) tablet 650 mg  650 mg Oral Q6H PRN Rise Patience, MD       Or   acetaminophen (TYLENOL) suppository 650 mg  650 mg Rectal Q6H PRN Rise Patience, MD       aspirin chewable tablet 81 mg  81 mg Oral Daily Rise Patience, MD   81 mg at 04/25/21 1009   azithromycin (ZITHROMAX) 500 mg in sodium chloride 0.9 % 250 mL IVPB  500 mg Intravenous Q24H Rise Patience, MD 250 mL/hr at 04/25/21 0610 500 mg at 04/25/21 0610   cefTRIAXone (ROCEPHIN) 2 g in sodium chloride 0.9 % 100 mL IVPB  2 g Intravenous Q0600 Rise Patience, MD   Stopping Infusion hung by another clincian at 04/25/21 1353   cholecalciferol (VITAMIN D3) tablet  1,000 Units  1,000 Units Oral Daily Rise Patience, MD   1,000 Units at 04/25/21 1009   enoxaparin (LOVENOX) injection 30 mg  30 mg Subcutaneous Q24H Rise Patience, MD   30 mg at 04/25/21 0515   gabapentin (NEURONTIN) capsule 100 mg  100 mg Oral BID Rise Patience, MD   100 mg at 04/25/21 1009   lisinopril (ZESTRIL) tablet 20 mg  20 mg Oral Daily Rise Patience, MD   20 mg at 04/25/21 1010   melatonin tablet 3 mg  3 mg Oral QHS [place], Marlowe Aschoff, MD   3 mg at 04/24/21  2110   metoprolol succinate (TOPROL-XL) 24 hr tablet 25 mg  25 mg Oral Daily Rise Patience, MD   25 mg at 04/25/21 1010   multivitamin with minerals tablet 1 tablet  1 tablet Oral Daily Rise Patience, MD   1 tablet at 04/25/21 1009     Discharge Medications: Please see discharge summary for a list of discharge medications.  Relevant Imaging Results:  Relevant Lab Results:   Additional Information SSN: [id number].  Pt is vaccinated for covid with both boosters.  Joanne Chars, LCSW

## 2021-04-26 DIAGNOSIS — J9601 Acute respiratory failure with hypoxia: Secondary | ICD-10-CM | POA: Diagnosis not present

## 2021-04-26 DIAGNOSIS — J189 Pneumonia, unspecified organism: Secondary | ICD-10-CM | POA: Diagnosis not present

## 2021-04-26 DIAGNOSIS — I872 Venous insufficiency (chronic) (peripheral): Secondary | ICD-10-CM | POA: Diagnosis not present

## 2021-04-26 DIAGNOSIS — R413 Other amnesia: Secondary | ICD-10-CM | POA: Diagnosis not present

## 2021-04-26 DIAGNOSIS — J90 Pleural effusion, not elsewhere classified: Secondary | ICD-10-CM | POA: Diagnosis not present

## 2021-04-26 DIAGNOSIS — M810 Age-related osteoporosis without current pathological fracture: Secondary | ICD-10-CM | POA: Diagnosis not present

## 2021-04-26 DIAGNOSIS — H8110 Benign paroxysmal vertigo, unspecified ear: Secondary | ICD-10-CM | POA: Diagnosis not present

## 2021-04-26 DIAGNOSIS — I480 Paroxysmal atrial fibrillation: Secondary | ICD-10-CM

## 2021-04-26 DIAGNOSIS — I272 Pulmonary hypertension, unspecified: Secondary | ICD-10-CM | POA: Diagnosis not present

## 2021-04-26 DIAGNOSIS — I5032 Chronic diastolic (congestive) heart failure: Secondary | ICD-10-CM | POA: Diagnosis not present

## 2021-04-26 DIAGNOSIS — R600 Localized edema: Secondary | ICD-10-CM | POA: Diagnosis not present

## 2021-04-26 DIAGNOSIS — Z85118 Personal history of other malignant neoplasm of bronchus and lung: Secondary | ICD-10-CM | POA: Diagnosis not present

## 2021-04-26 DIAGNOSIS — I110 Hypertensive heart disease with heart failure: Secondary | ICD-10-CM | POA: Diagnosis not present

## 2021-04-26 DIAGNOSIS — I10 Essential (primary) hypertension: Secondary | ICD-10-CM | POA: Diagnosis not present

## 2021-04-26 LAB — BASIC METABOLIC PANEL
Anion gap: 5 (ref 5–15)
BUN: 15 mg/dL (ref 8–23)
CO2: 32 mmol/L (ref 22–32)
Calcium: 8.3 mg/dL — ABNORMAL LOW (ref 8.9–10.3)
Chloride: 99 mmol/L (ref 98–111)
Creatinine, Ser: 0.64 mg/dL (ref 0.44–1.00)
GFR, Estimated: >60 mL/min (ref >60)
Glucose, Bld: 79 mg/dL (ref 70–99)
Potassium: 4.2 mmol/L (ref 3.5–5.1)
Sodium: 136 mmol/L (ref 135–145)

## 2021-04-26 LAB — CBC WITH DIFFERENTIAL/PLATELET
Abs Immature Granulocytes: 0.03 10*3/uL (ref 0.00–0.07)
Basophils Absolute: 0 10*3/uL (ref 0.0–0.1)
Basophils Relative: 0 %
Eosinophils Absolute: 0 10*3/uL (ref 0.0–0.5)
Eosinophils Relative: 1 %
HCT: 35.6 % — ABNORMAL LOW (ref 36.0–46.0)
Hemoglobin: 11.7 g/dL — ABNORMAL LOW (ref 12.0–15.0)
Immature Granulocytes: 1 %
Lymphocytes Relative: 12 %
Lymphs Abs: 0.7 10*3/uL (ref 0.7–4.0)
MCH: 31.6 pg (ref 26.0–34.0)
MCHC: 32.9 g/dL (ref 30.0–36.0)
MCV: 96.2 fL (ref 80.0–100.0)
Monocytes Absolute: 0.6 10*3/uL (ref 0.1–1.0)
Monocytes Relative: 11 %
Neutro Abs: 4.1 10*3/uL (ref 1.7–7.7)
Neutrophils Relative %: 75 %
Platelets: 110 10*3/uL — ABNORMAL LOW (ref 150–400)
RBC: 3.7 MIL/uL — ABNORMAL LOW (ref 3.87–5.11)
RDW: 14.1 % (ref 11.5–15.5)
WBC: 5.4 10*3/uL (ref 4.0–10.5)
nRBC: 0 % (ref 0.0–0.2)

## 2021-04-26 LAB — RESP PANEL BY RT-PCR (FLU A&B, COVID) ARPGX2
Influenza A by PCR: NEGATIVE
Influenza B by PCR: NEGATIVE
SARS Coronavirus 2 by RT PCR: NEGATIVE

## 2021-04-26 MED ORDER — CEFDINIR 250 MG/5ML PO SUSR: 300.0000 mg | Freq: Two times a day (BID) | ORAL | 0 refills | Status: AC

## 2021-04-26 MED ORDER — AZITHROMYCIN 500 MG PO TABS: 500.0000 mg | ORAL_TABLET | Freq: Every day | ORAL | 0 refills | Status: DC

## 2021-04-26 NOTE — Discharge Summary (Addendum)
Physician Discharge Summary  [address] Inf[place] YNW:[id number] DOB: 1924/06/21 DOA: 04/23/2021  PCP: Yvonna Alanis, NP  Admit date: 04/23/2021 Discharge date: 04/26/2021  Admitted From: ALF Disposition: SNF  Recommendations for Outpatient Follow-up:  Follow up with PCP in 1 week Wean patient to room air Repeat chest x-ray in 2-3 week Repeat CBC in 5-7 days Please follow up on the following pending results: Blood culture (final result)  Discharge Condition: Stable CODE STATUS: DNR Diet recommendation: Heart healthy   Brief/Interim Summary:  Admission HPI written by Rise Patience, MD   HPI: [name] is a 87 y.o. female with history of hypertension and paroxysmal A. fib.  And diastolic dysfunction per 2D echo done in 2018 and was brought to the ER after patient was getting more short of breath.  Patient is a poor historian and is not able to hold long she has been short of breath but she states that she has been having shortness of breath but no chest pain denies any productive cough fever or chills.    Hospital course:  Community acquired pneumonia Patient treated empirically with Ceftriaxone and azithromycin. Transition to Cefdinir and Azithromycin on discharge. Recommend repeat chest x-ray in 2-3 weeks.  Acute respiratory failure with hypoxia Secondary to above. Weaned to 1 L/min of oxygen. Wean to room air as able.  Primary hypertension Continue lisinopril  Bilateral pedal edema Appears to be dependent and related to venous insufficiency. Recommend conservative management. Will not prescribe Lasix on discharge.  History of paroxysmal atrial fibrillation Currently sinus rhythm. Continue Toprol XL  Discharge Diagnoses:  Principal Problem:   Acute respiratory failure with hypoxia (HCC) Active Problems:   PAF (paroxysmal atrial fibrillation) ([place])   CAP (community acquired pneumonia)    Discharge Instructions  Discharge Instructions      Consult for [facility] Medical Admission   Complete by: As directed       Allergies as of 04/26/2021       Reactions   Adhesive [tape]    Celebrex [celecoxib]    Merthiolate [thimerosal]    Neomycin    Penicillins Hives   Penicillins         Medication List     TAKE these medications    aspirin 81 MG chewable tablet Chew 81 mg by mouth daily.   azithromycin 500 MG tablet Commonly known as: Zithromax Take 1 tablet (500 mg total) by mouth daily for 2 days. Start 9/4 Start taking on: April 27, 2021   cefdinir 250 MG/5ML suspension Commonly known as: OMNICEF Take 6 mLs (300 mg total) by mouth 2 (two) times daily for 4 days. Start 9/4 Start taking on: April 27, 2021   Centrum MultiGummies Diona Fanti 2 tablets by mouth daily.   cholecalciferol 25 MCG (1000 UNIT) tablet Commonly known as: VITAMIN D Chew 1 tablet by mouth daily.   diclofenac Sodium 1 % Gel Commonly known as: VOLTAREN Apply 1 application topically 3 (three) times daily.   gabapentin 100 MG capsule Commonly known as: NEURONTIN Take 100 mg by mouth 2 (two) times daily.   lisinopril 20 MG tablet Commonly known as: ZESTRIL Take 20 mg by mouth daily.   metoprolol succinate 25 MG 24 hr tablet Commonly known as: TOPROL-XL Take 25 mg by mouth daily.        Follow-up Information     Yvonna Alanis, NP.   Specialty: Adult Health Nurse Practitioner Contact information: [address] [place] [address] [place] [address]  [phone]                Allergies  Allergen Reactions   Adhesive [Tape]    Celebrex [Celecoxib]    Merthiolate [Thimerosal]    Neomycin    Penicillins Hives   Penicillins     Consultations: None   Procedures/Studies: DG Chest 2 View  Result Date: 04/24/2021 CLINICAL DATA:  Shortness of breath and generalized weakness. EXAM: CHEST - 2 VIEW COMPARISON:  February 17, 2021 FINDINGS: The lungs are hyperinflated. Mild, diffuse, chronic appearing increased lung markings  are seen. Mild atelectasis and/or infiltrate is noted within the retrocardiac region of the left lung base. Small bilateral pleural effusions are noted. No pneumothorax is identified. Stable moderate severity enlargement of the cardiac silhouette is seen. Multiple chronic right-sided rib fractures are present. IMPRESSION: 1. Stable cardiomegaly with mild left basilar atelectasis and/or infiltrate. 2. Small bilateral pleural effusions. Electronically Signed   By: Virgina Norfolk M.D.   On: 04/24/2021 00:33   CT Angio Chest PE W and/or Wo Contrast  Result Date: 04/24/2021 CLINICAL DATA:  Chest pain and shortness of breath. EXAM: CT ANGIOGRAPHY CHEST WITH CONTRAST TECHNIQUE: Multidetector CT imaging of the chest was performed using the standard protocol during bolus administration of intravenous contrast. Multiplanar CT image reconstructions and MIPs were obtained to evaluate the vascular anatomy. CONTRAST:  78mL OMNIPAQUE IOHEXOL 350 MG/ML SOLN COMPARISON:  Chest CT dated 08/08/2017. Chest radiograph dated 04/24/2021. FINDINGS: Cardiovascular: There is moderate cardiomegaly. No pericardial effusion. There is moderate atherosclerotic calcification of the thoracic aorta. No aneurysmal dilatation. There is dilatation of the main pulmonary trunk suggestive of pulmonary hypertension. No pulmonary artery emboli identified. Mediastinum/Nodes: No hilar or mediastinal adenopathy. The esophagus is grossly unremarkable. No mediastinal fluid collection. Lungs/Pleura: Small bilateral pleural effusions with bibasilar compressive atelectasis. Patchy area of consolidation at the left lung base may represent atelectasis or pneumonia. No pneumothorax. The central airways are patent. Upper Abdomen: No acute abnormality. Musculoskeletal: Severe osteopenia and degenerative changes of the spine. Old fracture of the sternum. There is old compression fracture of T9 similar to prior CT with associated kyphosis. No acute osseous  pathology. Review of the MIP images confirms the above findings. IMPRESSION: 1. No CT evidence of pulmonary embolism. 2. Small bilateral pleural effusions with bibasilar compressive atelectasis. Patchy area of consolidation at the left lung base may represent atelectasis or pneumonia. 3. Moderate cardiomegaly. 4. Dilatation of the main pulmonary trunk suggestive of pulmonary hypertension. 5. Aortic Atherosclerosis (ICD10-I70.0). Electronically Signed   By: Anner Crete M.D.   On: 04/24/2021 01:31   ECHOCARDIOGRAM COMPLETE  Result Date: 04/24/2021    ECHOCARDIOGRAM REPORT   Patient Name:   Saniyyah IRIS Holland Falling Date of Exam: 04/24/2021 Medical Rec #:  [id number]               Height:       60.0 in Accession #:    [id number]              Weight:       110.6 lb Date of Birth:  04-Dec-1923               BSA:          1.451 m Patient Age:    87 years                BP:           126/70 mmHg Patient Gender: F  HR:           69 bpm. Exam Location:  Inpatient Procedure: 2D Echo, Cardiac Doppler and Color Doppler Indications:    W41.32 Acute systolic (congestive) heart failure  History:        Patient has prior history of Echocardiogram examinations, most                 recent 10/27/2018. CHF, Signs/Symptoms:Chest Pain, Shortness of                 Breath and Murmur; Risk Factors:Hypertension.  Sonographer:    MH Referring Phys: Delia  1. Left ventricular ejection fraction, by estimation, is 60 to 65%. The left ventricle has normal function. The left ventricle has no regional wall motion abnormalities. There is severe left ventricular hypertrophy. Left ventricular diastolic parameters  are consistent with Grade I diastolic dysfunction (impaired relaxation).  2. Right ventricular systolic function is normal. The right ventricular size is normal. There is normal pulmonary artery systolic pressure. The estimated right ventricular systolic pressure is 44.0 mmHg.  3. Left  atrial size was severely dilated.  4. The mitral valve is normal in structure. No evidence of mitral valve regurgitation. No evidence of mitral stenosis.  5. The aortic valve is functionally bicuspid. Aortic valve regurgitation is not visualized. Mild aortic valve stenosis. Aortic valve area, by VTI measures 1.38 cm. Aortic valve mean gradient measures 7.0 mmHg. Aortic valve Vmax measures 1.77 m/s.  6. The inferior vena cava is normal in size with greater than 50% respiratory variability, suggesting right atrial pressure of 3 mmHg. FINDINGS  Left Ventricle: Left ventricular ejection fraction, by estimation, is 60 to 65%. The left ventricle has normal function. The left ventricle has no regional wall motion abnormalities. The left ventricular internal cavity size was normal in size. There is  severe left ventricular hypertrophy. Left ventricular diastolic parameters are consistent with Grade I diastolic dysfunction (impaired relaxation). Right Ventricle: The right ventricular size is normal. No increase in right ventricular wall thickness. Right ventricular systolic function is normal. There is normal pulmonary artery systolic pressure. The tricuspid regurgitant velocity is 2.63 m/s, and  with an assumed right atrial pressure of 3 mmHg, the estimated right ventricular systolic pressure is 10.2 mmHg. Left Atrium: Left atrial size was severely dilated. Right Atrium: Right atrial size was normal in size. Pericardium: There is no evidence of pericardial effusion. Mitral Valve: The mitral valve is normal in structure. No evidence of mitral valve regurgitation. No evidence of mitral valve stenosis. Tricuspid Valve: The tricuspid valve is normal in structure. Tricuspid valve regurgitation is mild . No evidence of tricuspid stenosis. Aortic Valve: The aortic valve is bicuspid. Aortic valve regurgitation is not visualized. Mild aortic stenosis is present. Aortic valve mean gradient measures 7.0 mmHg. Aortic valve peak  gradient measures 12.5 mmHg. Aortic valve area, by VTI measures 1.38 cm. Pulmonic Valve: The pulmonic valve was normal in structure. Pulmonic valve regurgitation is trivial. No evidence of pulmonic stenosis. Aorta: The aortic root is normal in size and structure. Venous: The inferior vena cava is normal in size with greater than 50% respiratory variability, suggesting right atrial pressure of 3 mmHg. IAS/Shunts: No atrial level shunt detected by color flow Doppler.  LEFT VENTRICLE PLAX 2D LVIDd:         3.60 cm     Diastology LVIDs:         2.60 cm     LV e' medial:  3.48 cm/s LV PW:         1.90 cm     LV E/e' medial:  19.9 LV IVS:        1.90 cm     LV e' lateral:   6.64 cm/s LVOT diam:     1.80 cm     LV E/e' lateral: 10.5 LV SV:         61 LV SV Index:   42 LVOT Area:     2.54 cm  LV Volumes (MOD) LV vol d, MOD A4C: 42.1 ml LV vol s, MOD A4C: 12.2 ml LV SV MOD A4C:     42.1 ml RIGHT VENTRICLE            IVC RV S prime:     8.16 cm/s  IVC diam: 0.90 cm TAPSE (M-mode): 2.4 cm LEFT ATRIUM             Index       RIGHT ATRIUM           Index LA diam:        2.40 cm 1.65 cm/m  RA Area:     11.10 cm LA Vol (A2C):   32.8 ml 22.60 ml/m RA Volume:   24.90 ml  17.16 ml/m LA Vol (A4C):   78.9 ml 54.37 ml/m LA Biplane Vol: 54.1 ml 37.28 ml/m  AORTIC VALVE AV Area (Vmax):    1.34 cm AV Area (Vmean):   1.62 cm AV Area (VTI):     1.38 cm AV Vmax:           176.80 cm/s AV Vmean:          117.760 cm/s AV VTI:            0.442 m AV Peak Grad:      12.5 mmHg AV Mean Grad:      7.0 mmHg LVOT Vmax:         93.30 cm/s LVOT Vmean:        75.100 cm/s LVOT VTI:          0.240 m LVOT/AV VTI ratio: 0.54  AORTA Ao Root diam: 2.70 cm Ao Asc diam:  3.00 cm MITRAL VALVE               TRICUSPID VALVE MV Area (PHT): 2.56 cm    TR Peak grad:   27.7 mmHg MV E velocity: 69.40 cm/s  TR Vmax:        263.00 cm/s MV A velocity: 95.10 cm/s MV E/A ratio:  0.73        SHUNTS                            Systemic VTI:  0.24 m                             Systemic Diam: 1.80 cm Candee Furbish MD Electronically signed by Candee Furbish MD Signature Date/Time: 04/24/2021/11:28:38 AM    Final      Subjective: No issues overnight  Discharge Exam: Vitals:   04/26/21 0803 04/26/21 1157  BP: 136/63 132/63  Pulse: 64 69  Resp: 19 17  Temp: 97.7 F (36.5 C) 97.6 F (36.4 C)  SpO2: 100% (!) 89%   Vitals:   04/25/21 2139 04/26/21 0338 04/26/21 0803 04/26/21 1157  BP: (!) 167/71 137/84 136/63 132/63  Pulse: 61 70 64 69  Resp: 16 20 19 17   Temp: 97.6 F (36.4 C) 97.6 F (36.4 C) 97.7 F (36.5 C) 97.6 F (36.4 C)  TempSrc: Oral Oral Oral Oral  SpO2: 97% 100% 100% (!) 89%  Weight:      Height:        General: Pt is alert, awake, not in acute distress Cardiovascular: Diminished but no rales or wheezing Respiratory: CTA bilaterally, no wheezing, no rhonchi Abdominal: Soft, NT, ND, bowel sounds + Extremities: Trace BLE edema, no cyanosis    The results of significant diagnostics from this hospitalization (including imaging, microbiology, ancillary and laboratory) are listed below for reference.     Microbiology: Recent Results (from the past 240 hour(s))  Resp Panel by RT-PCR (Flu A&B, Covid) Nasopharyngeal Swab     Status: None   Collection Time: 04/23/21 10:55 PM   Specimen: Nasopharyngeal Swab; Nasopharyngeal(NP) swabs in vial transport medium  Result Value Ref Range Status   SARS Coronavirus 2 by RT PCR NEGATIVE NEGATIVE Final    Comment: (NOTE) SARS-CoV-2 target nucleic acids are NOT DETECTED.  The SARS-CoV-2 RNA is generally detectable in upper respiratory specimens during the acute phase of infection. The lowest concentration of SARS-CoV-2 viral copies this assay can detect is 138 copies/mL. A negative result does not preclude SARS-Cov-2 infection and should not be used as the sole basis for treatment or other patient management decisions. A negative result may occur with  improper specimen collection/handling,  submission of specimen other than nasopharyngeal swab, presence of viral mutation(s) within the areas targeted by this assay, and inadequate number of viral copies(<138 copies/mL). A negative result must be combined with clinical observations, patient history, and epidemiological information. The expected result is Negative.  Fact Sheet for Patients:  EntrepreneurPulse.com.au  Fact Sheet for Healthcare Providers:  IncredibleEmployment.be  This test is no t yet approved or cleared by the [place] FDA and  has been authorized for detection and/or diagnosis of SARS-CoV-2 by FDA under an Emergency Use Authorization (EUA). This EUA will remain  in effect (meaning this test can be used) for the duration of the COVID-19 declaration under Section 564(b)(1) of the Act, 21 U.S.C.section 360bbb-3(b)(1), unless the authorization is terminated  or revoked sooner.       Influenza A by PCR NEGATIVE NEGATIVE Final   Influenza B by PCR NEGATIVE NEGATIVE Final    Comment: (NOTE) The Xpert Xpress SARS-CoV-2/FLU/RSV plus assay is intended as an aid in the diagnosis of influenza from Nasopharyngeal swab specimens and should not be used as a sole basis for treatment. Nasal washings and aspirates are unacceptable for Xpert Xpress SARS-CoV-2/FLU/RSV testing.  Fact Sheet for Patients: EntrepreneurPulse.com.au  Fact Sheet for Healthcare Providers: IncredibleEmployment.be  This test is not yet approved or cleared by the [place] FDA and has been authorized for detection and/or diagnosis of SARS-CoV-2 by FDA under an Emergency Use Authorization (EUA). This EUA will remain in effect (meaning this test can be used) for the duration of the COVID-19 declaration under Section 564(b)(1) of the Act, 21 U.S.C. section 360bbb-3(b)(1), unless the authorization is terminated or revoked.  Performed at [facility], [place] [address], [place], [place] [address]   Culture, blood (routine x 2)     Status: None (Preliminary result)   Collection Time: 04/24/21  6:45 AM   Specimen: BLOOD LEFT HAND  Result Value Ref Range Status   Specimen Description BLOOD LEFT HAND  Final   Special Requests   Final  BOTTLES DRAWN AEROBIC AND ANAEROBIC Blood Culture results may not be optimal due to an inadequate volume of blood received in culture bottles   Culture   Final    NO GROWTH 2 DAYS Performed at [facility], [place] [address], [place], [place] [address]    Report Status PENDING  Incomplete  Culture, blood (routine x 2)     Status: None (Preliminary result)   Collection Time: 04/24/21  6:50 AM   Specimen: BLOOD  Result Value Ref Range Status   Specimen Description BLOOD LEFT ANTECUBITAL  Final   Special Requests   Final    BOTTLES DRAWN AEROBIC AND ANAEROBIC Blood Culture adequate volume   Culture   Final    NO GROWTH 2 DAYS Performed at [facility], [place] [address], [place], [place] [address]    Report Status PENDING  Incomplete  Resp Panel by RT-PCR (Flu A&B, Covid) Nasopharyngeal Swab     Status: None   Collection Time: 04/26/21 10:43 AM   Specimen: Nasopharyngeal Swab; Nasopharyngeal(NP) swabs in vial transport medium  Result Value Ref Range Status   SARS Coronavirus 2 by RT PCR NEGATIVE NEGATIVE Final    Comment: (NOTE) SARS-CoV-2 target nucleic acids are NOT DETECTED.  The SARS-CoV-2 RNA is generally detectable in upper respiratory specimens during the acute phase of infection. The lowest concentration of SARS-CoV-2 viral copies this assay can detect is 138 copies/mL. A negative result does not preclude SARS-Cov-2 infection and should not be used as the sole basis for treatment or other patient management decisions. A negative result may occur with  improper specimen collection/handling, submission of specimen other than nasopharyngeal swab, presence of viral mutation(s) within the areas  targeted by this assay, and inadequate number of viral copies(<138 copies/mL). A negative result must be combined with clinical observations, patient history, and epidemiological information. The expected result is Negative.  Fact Sheet for Patients:  EntrepreneurPulse.com.au  Fact Sheet for Healthcare Providers:  IncredibleEmployment.be  This test is no t yet approved or cleared by the [place] FDA and  has been authorized for detection and/or diagnosis of SARS-CoV-2 by FDA under an Emergency Use Authorization (EUA). This EUA will remain  in effect (meaning this test can be used) for the duration of the COVID-19 declaration under Section 564(b)(1) of the Act, 21 U.S.C.section 360bbb-3(b)(1), unless the authorization is terminated  or revoked sooner.       Influenza A by PCR NEGATIVE NEGATIVE Final   Influenza B by PCR NEGATIVE NEGATIVE Final    Comment: (NOTE) The Xpert Xpress SARS-CoV-2/FLU/RSV plus assay is intended as an aid in the diagnosis of influenza from Nasopharyngeal swab specimens and should not be used as a sole basis for treatment. Nasal washings and aspirates are unacceptable for Xpert Xpress SARS-CoV-2/FLU/RSV testing.  Fact Sheet for Patients: EntrepreneurPulse.com.au  Fact Sheet for Healthcare Providers: IncredibleEmployment.be  This test is not yet approved or cleared by the [place] FDA and has been authorized for detection and/or diagnosis of SARS-CoV-2 by FDA under an Emergency Use Authorization (EUA). This EUA will remain in effect (meaning this test can be used) for the duration of the COVID-19 declaration under Section 564(b)(1) of the Act, 21 U.S.C. section 360bbb-3(b)(1), unless the authorization is terminated or revoked.  Performed at [facility], [place] [address] [place], [place] [address]      Labs: BNP (last 3 results) Recent Labs    02/17/21 2102  04/23/21 2303  BNP 110.9* 129.8*  Basic Metabolic Panel: Recent Labs  Lab 04/23/21 2303 04/24/21 0617 04/26/21 0602  NA 137 136 136  K 4.4 3.7 4.2  CL 98 96* 99  CO2 34* 31 32  GLUCOSE 69* 86 79  BUN 7* 6* 15  CREATININE 0.59 0.53 0.64  CALCIUM 8.9 8.4* 8.3*  MG  --  1.9  --    Liver Function Tests: Recent Labs  Lab 04/24/21 0645  AST 26  ALT 45*  ALKPHOS 61  BILITOT 0.7  PROT 5.5*  ALBUMIN 3.1*   No results for input(s): LIPASE, AMYLASE in the last 168 hours. No results for input(s): AMMONIA in the last 168 hours. CBC: Recent Labs  Lab 04/23/21 2303 04/24/21 0617 04/26/21 0602  WBC 6.6 5.7 5.4  NEUTROABS 4.9 4.2 4.1  HGB 13.8 13.5 11.7*  HCT 41.3 40.1 35.6*  MCV 93.9 94.1 96.2  PLT 158 149* 110*   Cardiac Enzymes: No results for input(s): CKTOTAL, CKMB, CKMBINDEX, TROPONINI in the last 168 hours. BNP: Invalid input(s): POCBNP CBG: No results for input(s): GLUCAP in the last 168 hours. D-Dimer No results for input(s): DDIMER in the last 72 hours. Hgb A1c No results for input(s): HGBA1C in the last 72 hours. Lipid Profile No results for input(s): CHOL, HDL, LDLCALC, TRIG, CHOLHDL, LDLDIRECT in the last 72 hours. Thyroid function studies Recent Labs    04/24/21 0645  TSH 2.491   Anemia work up No results for input(s): VITAMINB12, FOLATE, FERRITIN, TIBC, IRON, RETICCTPCT in the last 72 hours. Urinalysis    Component Value Date/Time   COLORURINE YELLOW 02/17/2021 2102   APPEARANCEUR CLOUDY (A) 02/17/2021 2102   LABSPEC 1.011 02/17/2021 2102   PHURINE 8.0 02/17/2021 2102   GLUCOSEU NEGATIVE 02/17/2021 2102   HGBUR NEGATIVE 02/17/2021 2102   BILIRUBINUR NEGATIVE 02/17/2021 2102   KETONESUR NEGATIVE 02/17/2021 2102   PROTEINUR NEGATIVE 02/17/2021 2102   NITRITE NEGATIVE 02/17/2021 2102   LEUKOCYTESUR NEGATIVE 02/17/2021 2102   Sepsis Labs Invalid input(s): PROCALCITONIN,  WBC,  LACTICIDVEN Microbiology Recent Results (from the past 240  hour(s))  Resp Panel by RT-PCR (Flu A&B, Covid) Nasopharyngeal Swab     Status: None   Collection Time: 04/23/21 10:55 PM   Specimen: Nasopharyngeal Swab; Nasopharyngeal(NP) swabs in vial transport medium  Result Value Ref Range Status   SARS Coronavirus 2 by RT PCR NEGATIVE NEGATIVE Final    Comment: (NOTE) SARS-CoV-2 target nucleic acids are NOT DETECTED.  The SARS-CoV-2 RNA is generally detectable in upper respiratory specimens during the acute phase of infection. The lowest concentration of SARS-CoV-2 viral copies this assay can detect is 138 copies/mL. A negative result does not preclude SARS-Cov-2 infection and should not be used as the sole basis for treatment or other patient management decisions. A negative result may occur with  improper specimen collection/handling, submission of specimen other than nasopharyngeal swab, presence of viral mutation(s) within the areas targeted by this assay, and inadequate number of viral copies(<138 copies/mL). A negative result must be combined with clinical observations, patient history, and epidemiological information. The expected result is Negative.  Fact Sheet for Patients:  EntrepreneurPulse.com.au  Fact Sheet for Healthcare Providers:  IncredibleEmployment.be  This test is no t yet approved or cleared by the [place] FDA and  has been authorized for detection and/or diagnosis of SARS-CoV-2 by FDA under an Emergency Use Authorization (EUA). This EUA will remain  in effect (meaning this test can be used) for the duration of the COVID-19 declaration under Section 564(b)(1) of the  Act, 21 U.S.C.section 360bbb-3(b)(1), unless the authorization is terminated  or revoked sooner.       Influenza A by PCR NEGATIVE NEGATIVE Final   Influenza B by PCR NEGATIVE NEGATIVE Final    Comment: (NOTE) The Xpert Xpress SARS-CoV-2/FLU/RSV plus assay is intended as an aid in the diagnosis of influenza from  Nasopharyngeal swab specimens and should not be used as a sole basis for treatment. Nasal washings and aspirates are unacceptable for Xpert Xpress SARS-CoV-2/FLU/RSV testing.  Fact Sheet for Patients: EntrepreneurPulse.com.au  Fact Sheet for Healthcare Providers: IncredibleEmployment.be  This test is not yet approved or cleared by the [place] FDA and has been authorized for detection and/or diagnosis of SARS-CoV-2 by FDA under an Emergency Use Authorization (EUA). This EUA will remain in effect (meaning this test can be used) for the duration of the COVID-19 declaration under Section 564(b)(1) of the Act, 21 U.S.C. section 360bbb-3(b)(1), unless the authorization is terminated or revoked.  Performed at [facility], [place] [address], [place], [place] [address]   Culture, blood (routine x 2)     Status: None (Preliminary result)   Collection Time: 04/24/21  6:45 AM   Specimen: BLOOD LEFT HAND  Result Value Ref Range Status   Specimen Description BLOOD LEFT HAND  Final   Special Requests   Final    BOTTLES DRAWN AEROBIC AND ANAEROBIC Blood Culture results may not be optimal due to an inadequate volume of blood received in culture bottles   Culture   Final    NO GROWTH 2 DAYS Performed at [facility], [place] [address], [place], [place] [address]    Report Status PENDING  Incomplete  Culture, blood (routine x 2)     Status: None (Preliminary result)   Collection Time: 04/24/21  6:50 AM   Specimen: BLOOD  Result Value Ref Range Status   Specimen Description BLOOD LEFT ANTECUBITAL  Final   Special Requests   Final    BOTTLES DRAWN AEROBIC AND ANAEROBIC Blood Culture adequate volume   Culture   Final    NO GROWTH 2 DAYS Performed at [facility], [place] [address], [place],  [address]    Report Status PENDING  Incomplete  Resp Panel by RT-PCR (Flu A&B, Covid) Nasopharyngeal Swab     Status: None   Collection Time:  04/26/21 10:43 AM   Specimen: Nasopharyngeal Swab; Nasopharyngeal(NP) swabs in vial transport medium  Result Value Ref Range Status   SARS Coronavirus 2 by RT PCR NEGATIVE NEGATIVE Final    Comment: (NOTE) SARS-CoV-2 target nucleic acids are NOT DETECTED.  The SARS-CoV-2 RNA is generally detectable in upper respiratory specimens during the acute phase of infection. The lowest concentration of SARS-CoV-2 viral copies this assay can detect is 138 copies/mL. A negative result does not preclude SARS-Cov-2 infection and should not be used as the sole basis for treatment or other patient management decisions. A negative result may occur with  improper specimen collection/handling, submission of specimen other than nasopharyngeal swab, presence of viral mutation(s) within the areas targeted by this assay, and inadequate number of viral copies(<138 copies/mL). A negative result must be combined with clinical observations, patient history, and epidemiological information. The expected result is Negative.  Fact Sheet for Patients:  EntrepreneurPulse.com.au  Fact Sheet for Healthcare Providers:  IncredibleEmployment.be  This test is no t yet approved or cleared by the [place] FDA and  has been authorized for detection and/or diagnosis of SARS-CoV-2 by FDA under  an Emergency Use Authorization (EUA). This EUA will remain  in effect (meaning this test can be used) for the duration of the COVID-19 declaration under Section 564(b)(1) of the Act, 21 U.S.C.section 360bbb-3(b)(1), unless the authorization is terminated  or revoked sooner.       Influenza A by PCR NEGATIVE NEGATIVE Final   Influenza B by PCR NEGATIVE NEGATIVE Final    Comment: (NOTE) The Xpert Xpress SARS-CoV-2/FLU/RSV plus assay is intended as an aid in the diagnosis of influenza from Nasopharyngeal swab specimens and should not be used as a sole basis for treatment. Nasal washings  and aspirates are unacceptable for Xpert Xpress SARS-CoV-2/FLU/RSV testing.  Fact Sheet for Patients: EntrepreneurPulse.com.au  Fact Sheet for Healthcare Providers: IncredibleEmployment.be  This test is not yet approved or cleared by the [place] FDA and has been authorized for detection and/or diagnosis of SARS-CoV-2 by FDA under an Emergency Use Authorization (EUA). This EUA will remain in effect (meaning this test can be used) for the duration of the COVID-19 declaration under Section 564(b)(1) of the Act, 21 U.S.C. section 360bbb-3(b)(1), unless the authorization is terminated or revoked.  Performed at [facility], [place] [address], [place], [place] [address]      Time coordinating discharge: 35 minutes  SIGNED:   Cordelia Poche, MD [facility] 04/26/2021, 12:13 PM

## 2021-04-26 NOTE — Progress Notes (Signed)
Attempted to give report, Was told nurse was at lunch and I would get a phone call back.   The nurse that did answer the phone stated she would be the pts nurse after 5:30 pm but I would have to wait for the daytime nurse to return from lunch. I gave her the number for [address] and she stated, "I'll just be on a wild goose hunt and hung up the phone"

## 2021-04-29 ENCOUNTER — Encounter: Payer: Self-pay | Admitting: Internal Medicine

## 2021-04-29 ENCOUNTER — Non-Acute Institutional Stay (SKILLED_NURSING_FACILITY): Payer: PPO | Admitting: Internal Medicine

## 2021-04-29 DIAGNOSIS — J189 Pneumonia, unspecified organism: Secondary | ICD-10-CM

## 2021-04-29 DIAGNOSIS — R413 Other amnesia: Secondary | ICD-10-CM | POA: Diagnosis not present

## 2021-04-29 DIAGNOSIS — I272 Pulmonary hypertension, unspecified: Secondary | ICD-10-CM

## 2021-04-29 DIAGNOSIS — I10 Essential (primary) hypertension: Secondary | ICD-10-CM

## 2021-04-29 DIAGNOSIS — R600 Localized edema: Secondary | ICD-10-CM

## 2021-04-29 DIAGNOSIS — I480 Paroxysmal atrial fibrillation: Secondary | ICD-10-CM

## 2021-04-29 LAB — CULTURE, BLOOD (ROUTINE X 2)
Culture: NO GROWTH
Culture: NO GROWTH
Special Requests: ADEQUATE

## 2021-04-29 NOTE — Progress Notes (Signed)
Provider:  Veleta Miners MD Location:   [facility] Room Number: 04 Place of Service:  SNF (31)  PCP: Yvonna Alanis, NP Patient Care Team: Yvonna Alanis, NP as PCP - General (Adult Health Nurse Practitioner) Burnard Bunting, MD (Internal Medicine)  Extended Emergency Contact Information Primary Emergency Contact: Margo Aye Mobile Phone: [phone] Relation: Daughter Secondary Emergency Contact: Carilyn Goodpasture Address: [address] [place],  [place] Home Phone: [phone] Mobile Phone: [phone] Relation: Spouse  Code Status: Full Code Managed Care Goals of Care: Advanced Directive information Advanced Directives 04/29/2021  Does Patient Have a Medical Advance Directive? Yes  Type of Paramedic of [place];Living will  Does patient want to make changes to medical advance directive? No - Patient declined  Copy of [place] in Chart? Yes - validated most recent copy scanned in chart (See row information)  Would patient like information on creating a medical advance directive? -      Chief Complaint  Patient presents with   New Admit To SNF    Admission to SNF    HPI: Patient is a 87 y.o. female seen today for admission to SNF for therapy  Patient was admitted in Hospital from 8/31-9/3 For CAP  She has h/o A Fib Not on any anticoagulation Do to age and Frailty HTN BP has been elevated recently H/o Lung Cancer s/p Right Lobectomy H/o Osteoporosis  H/o Diastolic CHF Echo done  shows 55%  Lives in [place] with her husband Was send to the hospital for Acute Respiratory distress Was found to have CAP. Was treated with Rocephin and Azithromycin. CTA was negative for PE Patient improved and is now in SNF for therapy Her BP continues to run high. She also seems SOB on Exertion Has C/o Chronic Back pain  But otherwise doing well Cognition is impaired and she is frail . Very Hard of  hearing No Fever or cough     Past Medical History:  Diagnosis Date   Atrial tachycardia (HCC)    paroxysmal   Cancer ([facility] [place])    Lung--Status post right upper lobectomy   Cancer ([facility]) 1985   uterine   Cancer ([facility]) 1993   lung with right upper lobectomy   Cancer ([facility]) 1994   colon   Colon cancer ([facility])    Colectomy   Colon polyps 2002   Compression fracture    Esophageal stricture 2007   Grover's disease    Hiatal hernia 2007   Osteoporosis    Syncope and collapse    Pre-syncope   Uterine cancer ([facility]) 1985   complete hysterectomy   Venous stasis    chronic   Vertigo    Past Surgical History:  Procedure Laterality Date   CATARACT EXTRACTION     COLECTOMY     esophageal dilitation     HEMORRHOID SURGERY     INNER EAR SURGERY     LOBECTOMY     Right   LOBECTOMY Right 1993   REPLACEMENT TOTAL KNEE Left 2000   TOTAL ABDOMINAL HYSTERECTOMY     TOTAL KNEE ARTHROPLASTY     VEIN LIGATION AND STRIPPING      reports that she has never smoked. She has never used smokeless tobacco. She reports that she does not drink alcohol and does not use drugs. Social History   Socioeconomic History   Marital status: Married    Spouse name: Not  on file   Number of children: 3   Years of education: Not on file   Highest education level: Not on file  Occupational History   Occupation: Retired  Tobacco Use   Smoking status: Never   Smokeless tobacco: Never  Vaping Use   Vaping Use: Never used  Substance and Sexual Activity   Alcohol use: No   Drug use: No   Sexual activity: Not on file  Other Topics Concern   Not on file  Social History Narrative   ** Merged History Encounter **       Social Determinants of Health   Financial Resource Strain: Low Risk    Difficulty of Paying Living Expenses: Not hard at all  Food Insecurity: No Food Insecurity   Worried About Charity fundraiser in the Last Year: Never true   [place] in the Last Year: Never true   Transportation Needs: No Transportation Needs   Lack of Transportation (Medical): No   Lack of Transportation (Non-Medical): No  Physical Activity: Inactive   Days of Exercise per Week: 0 days   Minutes of Exercise per Session: 10 min  Stress: No Stress Concern Present   Feeling of Stress : Only a little  Social Connections: Moderately Isolated   Frequency of Communication with Friends and Family: Three times a week   Frequency of Social Gatherings with Friends and Family: Twice a week   Attends Religious Services: Never   Marine scientist or Organizations: No   Attends Music therapist: Never   Marital Status: Married  Human resources officer Violence: Not At Risk   Fear of Current or Ex-Partner: No   Emotionally Abused: No   Physically Abused: No   Sexually Abused: No    Functional Status Survey:    Family History  Problem Relation Age of Onset   Asthma Father    Emphysema Brother    Emphysema Brother    Breast cancer Maternal Grandmother    Lung cancer Sister    Heart disease Mother     Health Maintenance  Topic Date Due   TETANUS/TDAP  Never done   Zoster Vaccines- Shingrix (1 of 2) Never done   DEXA SCAN  Never done   PNA vac Low Risk Adult (1 of 2 - PCV13) Never done   INFLUENZA VACCINE  Never done   COVID-19 Vaccine  Completed   HPV VACCINES  Aged Out    Allergies  Allergen Reactions   Adhesive [Tape]    Celebrex [Celecoxib]    Merthiolate [Thimerosal]    Neomycin    Penicillins Hives   Penicillins     Allergies as of 04/29/2021       Reactions   Adhesive [tape]    Celebrex [celecoxib]    Merthiolate [thimerosal]    Neomycin    Penicillins Hives   Penicillins         Medication List        Accurate as of April 29, 2021 10:21 AM. If you have any questions, ask your nurse or doctor.          STOP taking these medications    azithromycin 500 MG tablet Commonly known as: Zithromax Stopped by: Virgie Dad, MD        TAKE these medications    aspirin 81 MG chewable tablet Chew 81 mg by mouth daily.   cefdinir 250 MG/5ML suspension Commonly known as: OMNICEF Take 6 mLs (300 mg total) by  mouth 2 (two) times daily for 4 days. Start 9/4   Centrum MultiGummies Chew Chew 2 tablets by mouth daily.   cholecalciferol 25 MCG (1000 UNIT) tablet Commonly known as: VITAMIN D Chew 1 tablet by mouth daily.   diclofenac Sodium 1 % Gel Commonly known as: VOLTAREN Apply 1 application topically 3 (three) times daily.   gabapentin 100 MG capsule Commonly known as: NEURONTIN Take 100 mg by mouth 2 (two) times daily.   lisinopril 20 MG tablet Commonly known as: ZESTRIL Take 20 mg by mouth daily.   metoprolol succinate 25 MG 24 hr tablet Commonly known as: TOPROL-XL Take 25 mg by mouth daily.        Review of Systems  Constitutional:  Positive for activity change and appetite change.  HENT: Negative.    Respiratory:  Positive for shortness of breath.   Cardiovascular:  Positive for leg swelling.  Gastrointestinal: Negative.   Genitourinary: Negative.   Musculoskeletal:  Positive for arthralgias, back pain, gait problem and myalgias.  Skin: Negative.   Neurological:  Positive for weakness.  Psychiatric/Behavioral:  Positive for confusion.    Vitals:   04/29/21 1017  BP: (!) 187/96  Pulse: 72  Resp: 16  Temp: 98.8 F (37.1 C)  SpO2: 90%  Weight: 110 lb 8 oz (50.1 kg)  Height: 5' (1.524 m)   Body mass index is 21.58 kg/m. Physical Exam Vitals reviewed.  Constitutional:      Appearance: Normal appearance.     Comments: Kyphosis and very hard of hearing  HENT:     Head: Normocephalic.     Nose: Nose normal.     Mouth/Throat:     Mouth: Mucous membranes are moist.     Pharynx: Oropharynx is clear.  Cardiovascular:     Rate and Rhythm: Normal rate and regular rhythm.     Pulses: Normal pulses.     Heart sounds: Normal heart sounds.  Pulmonary:     Effort: Pulmonary effort  is normal.     Comments: Has rales Bilateral  Abdominal:     General: Abdomen is flat. Bowel sounds are normal.     Palpations: Abdomen is soft.  Musculoskeletal:        General: Swelling present.     Cervical back: Neck supple.  Skin:    General: Skin is warm.  Neurological:     General: No focal deficit present.     Mental Status: She is alert.  Psychiatric:        Mood and Affect: Mood normal.        Thought Content: Thought content normal.    Labs reviewed: Basic Metabolic Panel: Recent Labs    04/23/21 2303 04/24/21 0617 04/26/21 0602  NA 137 136 136  K 4.4 3.7 4.2  CL 98 96* 99  CO2 34* 31 32  GLUCOSE 69* 86 79  BUN 7* 6* 15  CREATININE 0.59 0.53 0.64  CALCIUM 8.9 8.4* 8.3*  MG  --  1.9  --    Liver Function Tests: Recent Labs    02/17/21 2102 04/24/21 0645  AST 21 26  ALT 16 45*  ALKPHOS 46 61  BILITOT 0.4 0.7  PROT 5.2* 5.5*  ALBUMIN 3.0* 3.1*   No results for input(s): LIPASE, AMYLASE in the last 8760 hours. No results for input(s): AMMONIA in the last 8760 hours. CBC: Recent Labs    04/23/21 2303 04/24/21 0617 04/26/21 0602  WBC 6.6 5.7 5.4  NEUTROABS 4.9 4.2 4.1  HGB  13.8 13.5 11.7*  HCT 41.3 40.1 35.6*  MCV 93.9 94.1 96.2  PLT 158 149* 110*   Cardiac Enzymes: No results for input(s): CKTOTAL, CKMB, CKMBINDEX, TROPONINI in the last 8760 hours. BNP: Invalid input(s): POCBNP No results found for: HGBA1C Lab Results  Component Value Date   TSH 2.491 04/24/2021   Lab Results  Component Value Date   VITAMINB12 510 12/30/2006   Lab Results  Component Value Date   FOLATE > 20.0 ng/mL 12/30/2006   No results found for: IRON, TIBC, FERRITIN  Imaging and Procedures obtained prior to SNF admission: DG Chest 2 View  Result Date: 04/24/2021 CLINICAL DATA:  Shortness of breath and generalized weakness. EXAM: CHEST - 2 VIEW COMPARISON:  February 17, 2021 FINDINGS: The lungs are hyperinflated. Mild, diffuse, chronic appearing increased lung  markings are seen. Mild atelectasis and/or infiltrate is noted within the retrocardiac region of the left lung base. Small bilateral pleural effusions are noted. No pneumothorax is identified. Stable moderate severity enlargement of the cardiac silhouette is seen. Multiple chronic right-sided rib fractures are present. IMPRESSION: 1. Stable cardiomegaly with mild left basilar atelectasis and/or infiltrate. 2. Small bilateral pleural effusions. Electronically Signed   By: Virgina Norfolk M.D.   On: 04/24/2021 00:33   CT Angio Chest PE W and/or Wo Contrast  Result Date: 04/24/2021 CLINICAL DATA:  Chest pain and shortness of breath. EXAM: CT ANGIOGRAPHY CHEST WITH CONTRAST TECHNIQUE: Multidetector CT imaging of the chest was performed using the standard protocol during bolus administration of intravenous contrast. Multiplanar CT image reconstructions and MIPs were obtained to evaluate the vascular anatomy. CONTRAST:  58mL OMNIPAQUE IOHEXOL 350 MG/ML SOLN COMPARISON:  Chest CT dated 08/08/2017. Chest radiograph dated 04/24/2021. FINDINGS: Cardiovascular: There is moderate cardiomegaly. No pericardial effusion. There is moderate atherosclerotic calcification of the thoracic aorta. No aneurysmal dilatation. There is dilatation of the main pulmonary trunk suggestive of pulmonary hypertension. No pulmonary artery emboli identified. Mediastinum/Nodes: No hilar or mediastinal adenopathy. The esophagus is grossly unremarkable. No mediastinal fluid collection. Lungs/Pleura: Small bilateral pleural effusions with bibasilar compressive atelectasis. Patchy area of consolidation at the left lung base may represent atelectasis or pneumonia. No pneumothorax. The central airways are patent. Upper Abdomen: No acute abnormality. Musculoskeletal: Severe osteopenia and degenerative changes of the spine. Old fracture of the sternum. There is old compression fracture of T9 similar to prior CT with associated kyphosis. No acute osseous  pathology. Review of the MIP images confirms the above findings. IMPRESSION: 1. No CT evidence of pulmonary embolism. 2. Small bilateral pleural effusions with bibasilar compressive atelectasis. Patchy area of consolidation at the left lung base may represent atelectasis or pneumonia. 3. Moderate cardiomegaly. 4. Dilatation of the main pulmonary trunk suggestive of pulmonary hypertension. 5. Aortic Atherosclerosis (ICD10-I70.0). Electronically Signed   By: Anner Crete M.D.   On: 04/24/2021 01:31   ECHOCARDIOGRAM COMPLETE  Result Date: 04/24/2021    ECHOCARDIOGRAM REPORT   Patient Name:   Melita IRIS Holland Falling Date of Exam: 04/24/2021 Medical Rec #:  [id number]               Height:       60.0 in Accession #:    [id number]              Weight:       110.6 lb Date of Birth:  04/04/1924               BSA:          1.451  m Patient Age:    89 years                BP:           126/70 mmHg Patient Gender: F                       HR:           69 bpm. Exam Location:  Inpatient Procedure: 2D Echo, Cardiac Doppler and Color Doppler Indications:    W96.75 Acute systolic (congestive) heart failure  History:        Patient has prior history of Echocardiogram examinations, most                 recent 10/27/2018. CHF, Signs/Symptoms:Chest Pain, Shortness of                 Breath and Murmur; Risk Factors:Hypertension.  Sonographer:    MH Referring Phys: [place]  1. Left ventricular ejection fraction, by estimation, is 60 to 65%. The left ventricle has normal function. The left ventricle has no regional wall motion abnormalities. There is severe left ventricular hypertrophy. Left ventricular diastolic parameters  are consistent with Grade I diastolic dysfunction (impaired relaxation).  2. Right ventricular systolic function is normal. The right ventricular size is normal. There is normal pulmonary artery systolic pressure. The estimated right ventricular systolic pressure is 91.6 mmHg.  3. Left  atrial size was severely dilated.  4. The mitral valve is normal in structure. No evidence of mitral valve regurgitation. No evidence of mitral stenosis.  5. The aortic valve is functionally bicuspid. Aortic valve regurgitation is not visualized. Mild aortic valve stenosis. Aortic valve area, by VTI measures 1.38 cm. Aortic valve mean gradient measures 7.0 mmHg. Aortic valve Vmax measures 1.77 m/s.  6. The inferior vena cava is normal in size with greater than 50% respiratory variability, suggesting right atrial pressure of 3 mmHg. FINDINGS  Left Ventricle: Left ventricular ejection fraction, by estimation, is 60 to 65%. The left ventricle has normal function. The left ventricle has no regional wall motion abnormalities. The left ventricular internal cavity size was normal in size. There is  severe left ventricular hypertrophy. Left ventricular diastolic parameters are consistent with Grade I diastolic dysfunction (impaired relaxation). Right Ventricle: The right ventricular size is normal. No increase in right ventricular wall thickness. Right ventricular systolic function is normal. There is normal pulmonary artery systolic pressure. The tricuspid regurgitant velocity is 2.63 m/s, and  with an assumed right atrial pressure of 3 mmHg, the estimated right ventricular systolic pressure is 38.4 mmHg. Left Atrium: Left atrial size was severely dilated. Right Atrium: Right atrial size was normal in size. Pericardium: There is no evidence of pericardial effusion. Mitral Valve: The mitral valve is normal in structure. No evidence of mitral valve regurgitation. No evidence of mitral valve stenosis. Tricuspid Valve: The tricuspid valve is normal in structure. Tricuspid valve regurgitation is mild . No evidence of tricuspid stenosis. Aortic Valve: The aortic valve is bicuspid. Aortic valve regurgitation is not visualized. Mild aortic stenosis is present. Aortic valve mean gradient measures 7.0 mmHg. Aortic valve peak  gradient measures 12.5 mmHg. Aortic valve area, by VTI measures 1.38 cm. Pulmonic Valve: The pulmonic valve was normal in structure. Pulmonic valve regurgitation is trivial. No evidence of pulmonic stenosis. Aorta: The aortic root is normal in size and structure. Venous: The inferior vena cava is normal in size with greater than  50% respiratory variability, suggesting right atrial pressure of 3 mmHg. IAS/Shunts: No atrial level shunt detected by color flow Doppler.  LEFT VENTRICLE PLAX 2D LVIDd:         3.60 cm     Diastology LVIDs:         2.60 cm     LV e' medial:    3.48 cm/s LV PW:         1.90 cm     LV E/e' medial:  19.9 LV IVS:        1.90 cm     LV e' lateral:   6.64 cm/s LVOT diam:     1.80 cm     LV E/e' lateral: 10.5 LV SV:         61 LV SV Index:   42 LVOT Area:     2.54 cm  LV Volumes (MOD) LV vol d, MOD A4C: 42.1 ml LV vol s, MOD A4C: 12.2 ml LV SV MOD A4C:     42.1 ml RIGHT VENTRICLE            IVC RV S prime:     8.16 cm/s  IVC diam: 0.90 cm TAPSE (M-mode): 2.4 cm LEFT ATRIUM             Index       RIGHT ATRIUM           Index LA diam:        2.40 cm 1.65 cm/m  RA Area:     11.10 cm LA Vol (A2C):   32.8 ml 22.60 ml/m RA Volume:   24.90 ml  17.16 ml/m LA Vol (A4C):   78.9 ml 54.37 ml/m LA Biplane Vol: 54.1 ml 37.28 ml/m  AORTIC VALVE AV Area (Vmax):    1.34 cm AV Area (Vmean):   1.62 cm AV Area (VTI):     1.38 cm AV Vmax:           176.80 cm/s AV Vmean:          117.760 cm/s AV VTI:            0.442 m AV Peak Grad:      12.5 mmHg AV Mean Grad:      7.0 mmHg LVOT Vmax:         93.30 cm/s LVOT Vmean:        75.100 cm/s LVOT VTI:          0.240 m LVOT/AV VTI ratio: 0.54  AORTA Ao Root diam: 2.70 cm Ao Asc diam:  3.00 cm MITRAL VALVE               TRICUSPID VALVE MV Area (PHT): 2.56 cm    TR Peak grad:   27.7 mmHg MV E velocity: 69.40 cm/s  TR Vmax:        263.00 cm/s MV A velocity: 95.10 cm/s MV E/A ratio:  0.73        SHUNTS                            Systemic VTI:  0.24 m                             Systemic Diam: 1.80 cm Candee Furbish MD Electronically signed by Candee Furbish MD Signature Date/Time: 04/24/2021/11:28:38 AM    Final     Assessment/Plan Primary hypertension Change Lisinopril to 40  mg Hydralazine 10 mg PRN of SBP more then 180 Community acquired pneumonia of left lower lobe of lung Finishing her Cefdinir Bilateral leg edema Will start on Lasix  20 mg 3/week BNP elevated Repeat BMP  Pulmonary HTN (HCC) on CTA With LE extremities Swelling Lasix will help with her SOB  PAF (paroxysmal atrial fibrillation) (HCC) On Toprol XL Not on any Anticoagulation due to falls and Age Memory impairment Lives in [place] now. Goal is to go back there after getting stronger with therapy Shoulder and Back pain Now on Neurontin  Family/ staff Communication: Husband in the room  Labs/tests ordered:

## 2021-05-12 ENCOUNTER — Non-Acute Institutional Stay (SKILLED_NURSING_FACILITY): Payer: PPO | Admitting: Orthopedic Surgery

## 2021-05-12 ENCOUNTER — Encounter: Payer: Self-pay | Admitting: Orthopedic Surgery

## 2021-05-12 DIAGNOSIS — F419 Anxiety disorder, unspecified: Secondary | ICD-10-CM

## 2021-05-12 DIAGNOSIS — J189 Pneumonia, unspecified organism: Secondary | ICD-10-CM

## 2021-05-12 DIAGNOSIS — I10 Essential (primary) hypertension: Secondary | ICD-10-CM | POA: Diagnosis not present

## 2021-05-12 MED ORDER — SERTRALINE HCL 25 MG PO TABS: 25.0000 mg | ORAL_TABLET | Freq: Every day | ORAL | 3 refills | Status: AC

## 2021-05-12 NOTE — Progress Notes (Signed)
Location:   [facility] Room Number: 4 A Place of Service:  SNF ([id number]) Provider:  Windell Moulding, NP  Yvonna Alanis, NP  Patient Care Team: Yvonna Alanis, NP as PCP - General (Adult Health Nurse Practitioner) Burnard Bunting, MD (Internal Medicine)  Extended Emergency Contact Information Primary Emergency Contact: Margo Aye Mobile Phone: [phone] Relation: Daughter Secondary Emergency Contact: Carilyn Goodpasture Address: [address] [place],  [place] Home Phone: [phone] Mobile Phone: [phone] Relation: Spouse  Code Status:  DNR Goals of care: Advanced Directive information Advanced Directives 05/12/2021  Does Patient Have a Medical Advance Directive? Yes  Type of Paramedic of [place];Living will  Does patient want to make changes to medical advance directive? No - Patient declined  Copy of [place] in Chart? Yes - validated most recent copy scanned in chart (See row information)  Would patient like information on creating a medical advance directive? -     Chief Complaint  Patient presents with   Acute Visit    Increased shortness of breath and anxiety    HPI:  Pt is a 87 y.o. female seen today for an acute visit for increased shortness of breath and anxiety.   Recently hospitalized 08/31-09/03 for CAP. She was treated with Rocephin and Azithromycin. CT negative for PE. She was discharged to skilled nursing for additional therapy. Since her move to skilled nursing, staff has tried to tried to wean her off oxygen.They report increased anxiety over feeling short of breath. She is a poor historian due to poor cognition and hearing. She claims staff rushes her when ambulating from bed to bathroom and back. Reports rushing makes her feel increasingly sob. Her husband also reports spending most of his time in her room since skilled admission. When he tries to leave, she becomes upset  and reports not wanting to be alone. Last night she became increasingly anxious and stated " I am dying" when her husband tried to leave for the night. She was examined by nursing staff, vitals stable, sats 97-98% on 2 liters oxygen. On call provider ordered ativan 0.5 mg for anxiety. Ativan effective calming her down and she was able to fall asleep. I spoke with daughter today and she has noticed increased anxiety in her mom. She also believes the decision to move to skilled permanently has made her more anxious. Family plans to move her belongings tomorrow. F/u CXR scheduled to be done today.    Past Medical History:  Diagnosis Date   Atrial tachycardia ([place])    paroxysmal   Cancer ([place])    Lung--Status post right upper lobectomy   Cancer ([place]) 1985   uterine   Cancer ([place]) 1993   lung with right upper lobectomy   Cancer ([place]) 1994   colon   Colon cancer ([place])    Colectomy   Colon polyps 2002   Compression fracture    Esophageal stricture 2007   Grover's disease    Hiatal hernia 2007   Osteoporosis    Syncope and collapse    Pre-syncope   Uterine cancer ([place]) 1985   complete hysterectomy   Venous stasis    chronic   Vertigo    Past Surgical History:  Procedure Laterality Date   CATARACT EXTRACTION     COLECTOMY     esophageal dilitation     HEMORRHOID SURGERY     INNER EAR SURGERY  LOBECTOMY     Right   LOBECTOMY Right 1993   REPLACEMENT TOTAL KNEE Left 2000   TOTAL ABDOMINAL HYSTERECTOMY     TOTAL KNEE ARTHROPLASTY     VEIN LIGATION AND STRIPPING      Allergies  Allergen Reactions   Adhesive [Tape]    Celebrex [Celecoxib]    Merthiolate [Thimerosal]    Neomycin    Penicillins Hives   Penicillins     Allergies as of 05/12/2021       Reactions   Adhesive [tape]    Celebrex [celecoxib]    Merthiolate [thimerosal]    Neomycin    Penicillins Hives   Penicillins         Medication List        Accurate as of May 12, 2021 10:21 AM. If you  have any questions, ask your nurse or doctor.          aspirin 81 MG chewable tablet Chew 81 mg by mouth daily.   Centrum MultiGummies Chew Chew 2 tablets by mouth daily.   cholecalciferol 25 MCG (1000 UNIT) tablet Commonly known as: VITAMIN D Chew 1 tablet by mouth daily.   diclofenac Sodium 1 % Gel Commonly known as: VOLTAREN Apply 1 application topically 3 (three) times daily.   furosemide 20 MG tablet Commonly known as: LASIX Take 20 mg by mouth 3 (three) times a week.   gabapentin 100 MG capsule Commonly known as: NEURONTIN Take 100 mg by mouth 2 (two) times daily.   hydrALAZINE 10 MG tablet Commonly known as: APRESOLINE Take 10 mg by mouth daily.   lisinopril 40 MG tablet Commonly known as: ZESTRIL Take 40 mg by mouth daily.   metoprolol succinate 25 MG 24 hr tablet Commonly known as: TOPROL-XL Take 25 mg by mouth daily.   zinc oxide 20 % ointment Apply 1 application topically as needed for irritation. To buttocks after every incontinent episode and as needed for redness. May keep at bedside.        Review of Systems  Constitutional:  Negative for activity change, appetite change, fatigue and fever.  HENT:  Positive for hearing loss. Negative for congestion and sore throat.   Respiratory:  Positive for shortness of breath. Negative for cough and wheezing.   Cardiovascular:  Negative for chest pain and leg swelling.  Musculoskeletal:  Positive for gait problem.  Neurological:  Positive for weakness.  Psychiatric/Behavioral:  Positive for confusion. Negative for dysphoric mood. The patient is nervous/anxious.    Immunization History  Administered Date(s) Administered   Moderna SARS-COV2 Booster Vaccination 07/08/2020, 01/29/2021   Moderna Sars-Covid-2 Vaccination 08/31/2019, 09/25/2019   Pertinent  Health Maintenance Due  Topic Date Due   DEXA SCAN  Never done   INFLUENZA VACCINE  Never done   Fall Risk  04/23/2021  Falls in the past year? 1   Number falls in past yr: 1  Injury with Fall? 1  Risk for fall due to : History of fall(s);Impaired balance/gait;Impaired mobility  Follow up Falls evaluation completed;Education provided;Falls prevention discussed   Functional Status Survey:    Vitals:   05/12/21 1007  BP: (!) 162/88  Pulse: 88  Resp: 20  Temp: (!) 96.3 F (35.7 C)  SpO2: 96%  Weight: 113 lb 4.8 oz (51.4 kg)  Height: 5' (1.524 m)   Body mass index is 22.13 kg/m. Physical Exam Vitals reviewed.  Constitutional:      Comments: frail  HENT:     Head: Normocephalic.  Cardiovascular:  Rate and Rhythm: Normal rate. Rhythm irregular.     Pulses: Normal pulses.     Heart sounds: Normal heart sounds. No murmur heard. Pulmonary:     Effort: Pulmonary effort is normal. No respiratory distress.     Breath sounds: Normal breath sounds. No wheezing.     Comments: 2 liters oxygen Musculoskeletal:     Right lower leg: No edema.     Left lower leg: No edema.  Skin:    General: Skin is warm and dry.     Capillary Refill: Capillary refill takes less than 2 seconds.  Neurological:     General: No focal deficit present.     Mental Status: She is alert. Mental status is at baseline.     Motor: Weakness present.     Gait: Gait abnormal.  Psychiatric:        Attention and Perception: Attention normal.        Mood and Affect: Mood is anxious.    Labs reviewed: Recent Labs    04/23/21 2303 04/24/21 0617 04/26/21 0602  NA 137 136 136  K 4.4 3.7 4.2  CL 98 96* 99  CO2 34* 31 32  GLUCOSE 69* 86 79  BUN 7* 6* 15  CREATININE 0.59 0.53 0.64  CALCIUM 8.9 8.4* 8.3*  MG  --  1.9  --    Recent Labs    02/17/21 2102 04/24/21 0645  AST 21 26  ALT 16 45*  ALKPHOS 46 61  BILITOT 0.4 0.7  PROT 5.2* 5.5*  ALBUMIN 3.0* 3.1*   Recent Labs    04/23/21 2303 04/24/21 0617 04/26/21 0602  WBC 6.6 5.7 5.4  NEUTROABS 4.9 4.2 4.1  HGB 13.8 13.5 11.7*  HCT 41.3 40.1 35.6*  MCV 93.9 94.1 96.2  PLT 158 149* 110*    Lab Results  Component Value Date   TSH 2.491 04/24/2021   No results found for: HGBA1C No results found for: CHOL, HDL, LDLCALC, LDLDIRECT, TRIG, CHOLHDL  Significant Diagnostic Results in last 30 days:  DG Chest 2 View  Result Date: 04/24/2021 CLINICAL DATA:  Shortness of breath and generalized weakness. EXAM: CHEST - 2 VIEW COMPARISON:  February 17, 2021 FINDINGS: The lungs are hyperinflated. Mild, diffuse, chronic appearing increased lung markings are seen. Mild atelectasis and/or infiltrate is noted within the retrocardiac region of the left lung base. Small bilateral pleural effusions are noted. No pneumothorax is identified. Stable moderate severity enlargement of the cardiac silhouette is seen. Multiple chronic right-sided rib fractures are present. IMPRESSION: 1. Stable cardiomegaly with mild left basilar atelectasis and/or infiltrate. 2. Small bilateral pleural effusions. Electronically Signed   By: Virgina Norfolk M.D.   On: 04/24/2021 00:33   CT Angio Chest PE W and/or Wo Contrast  Result Date: 04/24/2021 CLINICAL DATA:  Chest pain and shortness of breath. EXAM: CT ANGIOGRAPHY CHEST WITH CONTRAST TECHNIQUE: Multidetector CT imaging of the chest was performed using the standard protocol during bolus administration of intravenous contrast. Multiplanar CT image reconstructions and MIPs were obtained to evaluate the vascular anatomy. CONTRAST:  34mL OMNIPAQUE IOHEXOL 350 MG/ML SOLN COMPARISON:  Chest CT dated 08/08/2017. Chest radiograph dated 04/24/2021. FINDINGS: Cardiovascular: There is moderate cardiomegaly. No pericardial effusion. There is moderate atherosclerotic calcification of the thoracic aorta. No aneurysmal dilatation. There is dilatation of the main pulmonary trunk suggestive of pulmonary hypertension. No pulmonary artery emboli identified. Mediastinum/Nodes: No hilar or mediastinal adenopathy. The esophagus is grossly unremarkable. No mediastinal fluid collection. Lungs/Pleura:  Small bilateral pleural  effusions with bibasilar compressive atelectasis. Patchy area of consolidation at the left lung base may represent atelectasis or pneumonia. No pneumothorax. The central airways are patent. Upper Abdomen: No acute abnormality. Musculoskeletal: Severe osteopenia and degenerative changes of the spine. Old fracture of the sternum. There is old compression fracture of T9 similar to prior CT with associated kyphosis. No acute osseous pathology. Review of the MIP images confirms the above findings. IMPRESSION: 1. No CT evidence of pulmonary embolism. 2. Small bilateral pleural effusions with bibasilar compressive atelectasis. Patchy area of consolidation at the left lung base may represent atelectasis or pneumonia. 3. Moderate cardiomegaly. 4. Dilatation of the main pulmonary trunk suggestive of pulmonary hypertension. 5. Aortic Atherosclerosis (ICD10-I70.0). Electronically Signed   By: Anner Crete M.D.   On: 04/24/2021 01:31   ECHOCARDIOGRAM COMPLETE  Result Date: 04/24/2021    ECHOCARDIOGRAM REPORT   Patient Name:   English IRIS BATTS [facility] Date of Exam: 04/24/2021 Medical Rec #:  [id number]               Height:       60.0 in Accession #:    [id number]              Weight:       110.6 lb Date of Birth:  07/06/1924               BSA:          1.451 m Patient Age:    87 years                BP:           126/70 mmHg Patient Gender: F                       HR:           69 bpm. Exam Location:  Inpatient Procedure: 2D Echo, Cardiac Doppler and Color Doppler Indications:    N47.09 Acute systolic (congestive) heart failure  History:        Patient has prior history of Echocardiogram examinations, most                 recent 10/27/2018. CHF, Signs/Symptoms:Chest Pain, Shortness of                 Breath and Murmur; Risk Factors:Hypertension.  Sonographer:    MH Referring Phys: [place]  1. Left ventricular ejection fraction, by estimation, is 60 to 65%. The left ventricle  has normal function. The left ventricle has no regional wall motion abnormalities. There is severe left ventricular hypertrophy. Left ventricular diastolic parameters  are consistent with Grade I diastolic dysfunction (impaired relaxation).  2. Right ventricular systolic function is normal. The right ventricular size is normal. There is normal pulmonary artery systolic pressure. The estimated right ventricular systolic pressure is 62.8 mmHg.  3. Left atrial size was severely dilated.  4. The mitral valve is normal in structure. No evidence of mitral valve regurgitation. No evidence of mitral stenosis.  5. The aortic valve is functionally bicuspid. Aortic valve regurgitation is not visualized. Mild aortic valve stenosis. Aortic valve area, by VTI measures 1.38 cm. Aortic valve mean gradient measures 7.0 mmHg. Aortic valve Vmax measures 1.77 m/s.  6. The inferior vena cava is normal in size with greater than 50% respiratory variability, suggesting right atrial pressure of 3 mmHg. FINDINGS  Left Ventricle: Left ventricular ejection fraction, by estimation, is 60 to 65%.  The left ventricle has normal function. The left ventricle has no regional wall motion abnormalities. The left ventricular internal cavity size was normal in size. There is  severe left ventricular hypertrophy. Left ventricular diastolic parameters are consistent with Grade I diastolic dysfunction (impaired relaxation). Right Ventricle: The right ventricular size is normal. No increase in right ventricular wall thickness. Right ventricular systolic function is normal. There is normal pulmonary artery systolic pressure. The tricuspid regurgitant velocity is 2.63 m/s, and  with an assumed right atrial pressure of 3 mmHg, the estimated right ventricular systolic pressure is 51.8 mmHg. Left Atrium: Left atrial size was severely dilated. Right Atrium: Right atrial size was normal in size. Pericardium: There is no evidence of pericardial effusion. Mitral  Valve: The mitral valve is normal in structure. No evidence of mitral valve regurgitation. No evidence of mitral valve stenosis. Tricuspid Valve: The tricuspid valve is normal in structure. Tricuspid valve regurgitation is mild . No evidence of tricuspid stenosis. Aortic Valve: The aortic valve is bicuspid. Aortic valve regurgitation is not visualized. Mild aortic stenosis is present. Aortic valve mean gradient measures 7.0 mmHg. Aortic valve peak gradient measures 12.5 mmHg. Aortic valve area, by VTI measures 1.38 cm. Pulmonic Valve: The pulmonic valve was normal in structure. Pulmonic valve regurgitation is trivial. No evidence of pulmonic stenosis. Aorta: The aortic root is normal in size and structure. Venous: The inferior vena cava is normal in size with greater than 50% respiratory variability, suggesting right atrial pressure of 3 mmHg. IAS/Shunts: No atrial level shunt detected by color flow Doppler.  LEFT VENTRICLE PLAX 2D LVIDd:         3.60 cm     Diastology LVIDs:         2.60 cm     LV e' medial:    3.48 cm/s LV PW:         1.90 cm     LV E/e' medial:  19.9 LV IVS:        1.90 cm     LV e' lateral:   6.64 cm/s LVOT diam:     1.80 cm     LV E/e' lateral: 10.5 LV SV:         61 LV SV Index:   42 LVOT Area:     2.54 cm  LV Volumes (MOD) LV vol d, MOD A4C: 42.1 ml LV vol s, MOD A4C: 12.2 ml LV SV MOD A4C:     42.1 ml RIGHT VENTRICLE            IVC RV S prime:     8.16 cm/s  IVC diam: 0.90 cm TAPSE (M-mode): 2.4 cm LEFT ATRIUM             Index       RIGHT ATRIUM           Index LA diam:        2.40 cm 1.65 cm/m  RA Area:     11.10 cm LA Vol (A2C):   32.8 ml 22.60 ml/m RA Volume:   24.90 ml  17.16 ml/m LA Vol (A4C):   78.9 ml 54.37 ml/m LA Biplane Vol: 54.1 ml 37.28 ml/m  AORTIC VALVE AV Area (Vmax):    1.34 cm AV Area (Vmean):   1.62 cm AV Area (VTI):     1.38 cm AV Vmax:           176.80 cm/s AV Vmean:          117.760 cm/s  AV VTI:            0.442 m AV Peak Grad:      12.5 mmHg AV Mean Grad:       7.0 mmHg LVOT Vmax:         93.30 cm/s LVOT Vmean:        75.100 cm/s LVOT VTI:          0.240 m LVOT/AV VTI ratio: 0.54  AORTA Ao Root diam: 2.70 cm Ao Asc diam:  3.00 cm MITRAL VALVE               TRICUSPID VALVE MV Area (PHT): 2.56 cm    TR Peak grad:   27.7 mmHg MV E velocity: 69.40 cm/s  TR Vmax:        263.00 cm/s MV A velocity: 95.10 cm/s MV E/A ratio:  0.73        SHUNTS                            Systemic VTI:  0.24 m                            Systemic Diam: 1.80 cm Candee Furbish MD Electronically signed by Candee Furbish MD Signature Date/Time: 04/24/2021/11:28:38 AM    Final     Assessment/Plan 1. Community acquired pneumonia of left lower lobe of lung - hospitalized 08/31-09/03 - treated with Rocephin and Azithromycin - lung sound clear today - remains on 2 liters oxygen - cont to wean oxygen - f/u CXR scheduled for today  2. Anxiety - suspect recent decline in health and move to skilled as cause - start zoloft 25 mg po daily - cont ativan 0.5 mg po bid prn x 14 days    Family/ staff Communication: plan discussed with patient and nurse  Labs/tests ordered:   none

## 2021-05-13 DIAGNOSIS — R0602 Shortness of breath: Secondary | ICD-10-CM | POA: Diagnosis not present

## 2021-05-22 ENCOUNTER — Emergency Department (HOSPITAL_COMMUNITY): Payer: PPO

## 2021-05-22 ENCOUNTER — Inpatient Hospital Stay (HOSPITAL_COMMUNITY)
Admission: EM | Admit: 2021-05-22 | Discharge: 2021-05-24 | DRG: 871 | Disposition: A | Discharge status: Hospice | Payer: PPO | Attending: Family Medicine | Admitting: Family Medicine

## 2021-05-22 ENCOUNTER — Encounter (HOSPITAL_COMMUNITY): Payer: Self-pay

## 2021-05-22 DIAGNOSIS — S42032A Displaced fracture of lateral end of left clavicle, initial encounter for closed fracture: Secondary | ICD-10-CM | POA: Diagnosis present

## 2021-05-22 DIAGNOSIS — Z515 Encounter for palliative care: Secondary | ICD-10-CM | POA: Diagnosis not present

## 2021-05-22 DIAGNOSIS — S40011A Contusion of right shoulder, initial encounter: Secondary | ICD-10-CM | POA: Diagnosis present

## 2021-05-22 DIAGNOSIS — G309 Alzheimer's disease, unspecified: Secondary | ICD-10-CM

## 2021-05-22 DIAGNOSIS — Z886 Allergy status to analgesic agent status: Secondary | ICD-10-CM

## 2021-05-22 DIAGNOSIS — Z8701 Personal history of pneumonia (recurrent): Secondary | ICD-10-CM | POA: Diagnosis not present

## 2021-05-22 DIAGNOSIS — S0003XA Contusion of scalp, initial encounter: Secondary | ICD-10-CM | POA: Diagnosis not present

## 2021-05-22 DIAGNOSIS — I517 Cardiomegaly: Secondary | ICD-10-CM | POA: Diagnosis not present

## 2021-05-22 DIAGNOSIS — I110 Hypertensive heart disease with heart failure: Secondary | ICD-10-CM | POA: Diagnosis not present

## 2021-05-22 DIAGNOSIS — Z789 Other specified health status: Secondary | ICD-10-CM | POA: Diagnosis not present

## 2021-05-22 DIAGNOSIS — S199XXA Unspecified injury of neck, initial encounter: Secondary | ICD-10-CM | POA: Diagnosis not present

## 2021-05-22 DIAGNOSIS — F0281 Dementia in other diseases classified elsewhere with behavioral disturbance: Secondary | ICD-10-CM | POA: Diagnosis not present

## 2021-05-22 DIAGNOSIS — Y92129 Unspecified place in nursing home as the place of occurrence of the external cause: Secondary | ICD-10-CM

## 2021-05-22 DIAGNOSIS — F03918 Unspecified dementia, unspecified severity, with other behavioral disturbance: Secondary | ICD-10-CM | POA: Diagnosis not present

## 2021-05-22 DIAGNOSIS — T68XXXA Hypothermia, initial encounter: Secondary | ICD-10-CM

## 2021-05-22 DIAGNOSIS — J189 Pneumonia, unspecified organism: Secondary | ICD-10-CM | POA: Diagnosis not present

## 2021-05-22 DIAGNOSIS — Z888 Allergy status to other drugs, medicaments and biological substances status: Secondary | ICD-10-CM

## 2021-05-22 DIAGNOSIS — Z881 Allergy status to other antibiotic agents status: Secondary | ICD-10-CM

## 2021-05-22 DIAGNOSIS — J90 Pleural effusion, not elsewhere classified: Secondary | ICD-10-CM | POA: Diagnosis not present

## 2021-05-22 DIAGNOSIS — I5032 Chronic diastolic (congestive) heart failure: Secondary | ICD-10-CM | POA: Diagnosis not present

## 2021-05-22 DIAGNOSIS — Z9181 History of falling: Secondary | ICD-10-CM | POA: Diagnosis not present

## 2021-05-22 DIAGNOSIS — H9190 Unspecified hearing loss, unspecified ear: Secondary | ICD-10-CM | POA: Diagnosis present

## 2021-05-22 DIAGNOSIS — S40012A Contusion of left shoulder, initial encounter: Secondary | ICD-10-CM | POA: Diagnosis not present

## 2021-05-22 DIAGNOSIS — Z79899 Other long term (current) drug therapy: Secondary | ICD-10-CM

## 2021-05-22 DIAGNOSIS — Z85038 Personal history of other malignant neoplasm of large intestine: Secondary | ICD-10-CM

## 2021-05-22 DIAGNOSIS — S0083XA Contusion of other part of head, initial encounter: Secondary | ICD-10-CM | POA: Diagnosis not present

## 2021-05-22 DIAGNOSIS — W19XXXA Unspecified fall, initial encounter: Secondary | ICD-10-CM | POA: Diagnosis present

## 2021-05-22 DIAGNOSIS — M958 Other specified acquired deformities of musculoskeletal system: Secondary | ICD-10-CM | POA: Diagnosis not present

## 2021-05-22 DIAGNOSIS — M810 Age-related osteoporosis without current pathological fracture: Secondary | ICD-10-CM | POA: Diagnosis not present

## 2021-05-22 DIAGNOSIS — Z20822 Contact with and (suspected) exposure to covid-19: Secondary | ICD-10-CM | POA: Diagnosis present

## 2021-05-22 DIAGNOSIS — Z7189 Other specified counseling: Secondary | ICD-10-CM

## 2021-05-22 DIAGNOSIS — R531 Weakness: Secondary | ICD-10-CM | POA: Diagnosis not present

## 2021-05-22 DIAGNOSIS — Z66 Do not resuscitate: Secondary | ICD-10-CM

## 2021-05-22 DIAGNOSIS — E559 Vitamin D deficiency, unspecified: Secondary | ICD-10-CM | POA: Diagnosis not present

## 2021-05-22 DIAGNOSIS — R410 Disorientation, unspecified: Secondary | ICD-10-CM | POA: Diagnosis not present

## 2021-05-22 DIAGNOSIS — Z743 Need for continuous supervision: Secondary | ICD-10-CM | POA: Diagnosis not present

## 2021-05-22 DIAGNOSIS — S8002XA Contusion of left knee, initial encounter: Secondary | ICD-10-CM | POA: Diagnosis not present

## 2021-05-22 DIAGNOSIS — R404 Transient alteration of awareness: Secondary | ICD-10-CM | POA: Diagnosis not present

## 2021-05-22 DIAGNOSIS — G9340 Encephalopathy, unspecified: Secondary | ICD-10-CM | POA: Diagnosis not present

## 2021-05-22 DIAGNOSIS — R680 Hypothermia, not associated with low environmental temperature: Secondary | ICD-10-CM | POA: Diagnosis not present

## 2021-05-22 DIAGNOSIS — Z85118 Personal history of other malignant neoplasm of bronchus and lung: Secondary | ICD-10-CM

## 2021-05-22 DIAGNOSIS — Z7982 Long term (current) use of aspirin: Secondary | ICD-10-CM

## 2021-05-22 DIAGNOSIS — I480 Paroxysmal atrial fibrillation: Secondary | ICD-10-CM | POA: Diagnosis not present

## 2021-05-22 DIAGNOSIS — J9811 Atelectasis: Secondary | ICD-10-CM | POA: Diagnosis not present

## 2021-05-22 DIAGNOSIS — S0181XA Laceration without foreign body of other part of head, initial encounter: Secondary | ICD-10-CM | POA: Diagnosis present

## 2021-05-22 DIAGNOSIS — Z9109 Other allergy status, other than to drugs and biological substances: Secondary | ICD-10-CM

## 2021-05-22 DIAGNOSIS — J690 Pneumonitis due to inhalation of food and vomit: Secondary | ICD-10-CM | POA: Diagnosis present

## 2021-05-22 DIAGNOSIS — R58 Hemorrhage, not elsewhere classified: Secondary | ICD-10-CM | POA: Diagnosis not present

## 2021-05-22 DIAGNOSIS — Y95 Nosocomial condition: Secondary | ICD-10-CM | POA: Diagnosis present

## 2021-05-22 DIAGNOSIS — E041 Nontoxic single thyroid nodule: Secondary | ICD-10-CM | POA: Diagnosis not present

## 2021-05-22 DIAGNOSIS — I10 Essential (primary) hypertension: Secondary | ICD-10-CM | POA: Diagnosis not present

## 2021-05-22 DIAGNOSIS — Z96652 Presence of left artificial knee joint: Secondary | ICD-10-CM | POA: Diagnosis not present

## 2021-05-22 DIAGNOSIS — F419 Anxiety disorder, unspecified: Secondary | ICD-10-CM | POA: Diagnosis present

## 2021-05-22 DIAGNOSIS — R0902 Hypoxemia: Secondary | ICD-10-CM | POA: Diagnosis not present

## 2021-05-22 DIAGNOSIS — A419 Sepsis, unspecified organism: Principal | ICD-10-CM | POA: Diagnosis present

## 2021-05-22 DIAGNOSIS — Z880 Allergy status to penicillin: Secondary | ICD-10-CM

## 2021-05-22 DIAGNOSIS — M11261 Other chondrocalcinosis, right knee: Secondary | ICD-10-CM | POA: Diagnosis not present

## 2021-05-22 DIAGNOSIS — M4319 Spondylolisthesis, multiple sites in spine: Secondary | ICD-10-CM | POA: Diagnosis not present

## 2021-05-22 DIAGNOSIS — S8001XA Contusion of right knee, initial encounter: Secondary | ICD-10-CM | POA: Diagnosis not present

## 2021-05-22 HISTORY — DX: Heart failure, unspecified: I50.9

## 2021-05-22 HISTORY — DX: Essential (primary) hypertension: I10

## 2021-05-22 HISTORY — DX: Unspecified atrial fibrillation: I48.91

## 2021-05-22 HISTORY — DX: Benign paroxysmal vertigo, unspecified ear: H81.10

## 2021-05-22 HISTORY — DX: Age-related osteoporosis without current pathological fracture: M81.0

## 2021-05-22 HISTORY — DX: Malignant neoplasm of unspecified part of unspecified bronchus or lung: C34.90

## 2021-05-22 HISTORY — DX: Vitamin D deficiency, unspecified: E55.9

## 2021-05-22 HISTORY — DX: Unspecified hearing loss, unspecified ear: H91.90

## 2021-05-22 LAB — CBC WITH DIFFERENTIAL/PLATELET
Abs Immature Granulocytes: 0.11 10*3/uL — ABNORMAL HIGH (ref 0.00–0.07)
Basophils Absolute: 0.1 10*3/uL (ref 0.0–0.1)
Basophils Relative: 0 %
Eosinophils Absolute: 0 10*3/uL (ref 0.0–0.5)
Eosinophils Relative: 0 %
HCT: 36.7 % (ref 36.0–46.0)
Hemoglobin: 12.5 g/dL (ref 12.0–15.0)
Immature Granulocytes: 1 %
Lymphocytes Relative: 5 %
Lymphs Abs: 0.8 10*3/uL (ref 0.7–4.0)
MCH: 31.7 pg (ref 26.0–34.0)
MCHC: 34.1 g/dL (ref 30.0–36.0)
MCV: 93.1 fL (ref 80.0–100.0)
Monocytes Absolute: 1.4 10*3/uL — ABNORMAL HIGH (ref 0.1–1.0)
Monocytes Relative: 9 %
Neutro Abs: 13 10*3/uL — ABNORMAL HIGH (ref 1.7–7.7)
Neutrophils Relative %: 85 %
Platelets: 188 10*3/uL (ref 150–400)
RBC: 3.94 MIL/uL (ref 3.87–5.11)
RDW: 13.8 % (ref 11.5–15.5)
WBC: 15.4 10*3/uL — ABNORMAL HIGH (ref 4.0–10.5)
nRBC: 0 % (ref 0.0–0.2)

## 2021-05-22 LAB — COMPREHENSIVE METABOLIC PANEL
ALT: 24 U/L (ref 0–44)
AST: 27 U/L (ref 15–41)
Albumin: 2.9 g/dL — ABNORMAL LOW (ref 3.5–5.0)
Alkaline Phosphatase: 75 U/L (ref 38–126)
Anion gap: 11 (ref 5–15)
BUN: 18 mg/dL (ref 8–23)
CO2: 37 mmol/L — ABNORMAL HIGH (ref 22–32)
Calcium: 9 mg/dL (ref 8.9–10.3)
Chloride: 86 mmol/L — ABNORMAL LOW (ref 98–111)
Creatinine, Ser: 0.91 mg/dL (ref 0.44–1.00)
GFR, Estimated: 58 mL/min — ABNORMAL LOW (ref >60)
Glucose, Bld: 130 mg/dL — ABNORMAL HIGH (ref 70–99)
Potassium: 4.5 mmol/L (ref 3.5–5.1)
Sodium: 134 mmol/L — ABNORMAL LOW (ref 135–145)
Total Bilirubin: 1.3 mg/dL — ABNORMAL HIGH (ref 0.3–1.2)
Total Protein: 5.2 g/dL — ABNORMAL LOW (ref 6.5–8.1)

## 2021-05-22 LAB — PROCALCITONIN: Procalcitonin: <0.1 ng/mL

## 2021-05-22 LAB — LACTIC ACID, PLASMA
Lactic Acid, Venous: 3.3 mmol/L (ref 0.5–1.9)
Lactic Acid, Venous: 1.8 mmol/L (ref 0.5–1.9)

## 2021-05-22 LAB — URINALYSIS, ROUTINE W REFLEX MICROSCOPIC
Bilirubin Urine: NEGATIVE
Glucose, UA: NEGATIVE mg/dL
Hgb urine dipstick: NEGATIVE
Ketones, ur: 20 mg/dL — AB
Leukocytes,Ua: NEGATIVE
Nitrite: NEGATIVE
Protein, ur: NEGATIVE mg/dL
Specific Gravity, Urine: 1.011 (ref 1.005–1.030)
pH: 7 (ref 5.0–8.0)

## 2021-05-22 LAB — APTT: aPTT: 29 seconds (ref 24–36)

## 2021-05-22 LAB — RESP PANEL BY RT-PCR (FLU A&B, COVID) ARPGX2
Influenza A by PCR: NEGATIVE
Influenza B by PCR: NEGATIVE
SARS Coronavirus 2 by RT PCR: NEGATIVE

## 2021-05-22 LAB — PROTIME-INR
INR: 1.1 (ref 0.8–1.2)
Prothrombin Time: 14.3 seconds (ref 11.4–15.2)

## 2021-05-22 MED ORDER — LIDOCAINE HCL (PF) 1 % IJ SOLN
INTRAMUSCULAR | Status: AC
  Administered 2021-05-22: 30 mL
  Filled 2021-05-22: qty 30

## 2021-05-22 MED ORDER — LIDOCAINE-EPINEPHRINE 1 %-1:100000 IJ SOLN: 10.0000 mL | Freq: Once | INTRAMUSCULAR | Status: DC

## 2021-05-22 MED ORDER — LACTATED RINGERS IV SOLN: INTRAVENOUS | Status: DC

## 2021-05-22 MED ORDER — LORAZEPAM 1 MG PO TABS: 0.5000 mg | ORAL_TABLET | Freq: Two times a day (BID) | ORAL | Status: DC | PRN

## 2021-05-22 MED ORDER — SODIUM CHLORIDE 0.9 % IV SOLN
2.0000 g | INTRAVENOUS | Status: DC
  Administered 2021-05-23: 2 g via INTRAVENOUS
  Filled 2021-05-22: qty 2

## 2021-05-22 MED ORDER — LACTATED RINGERS IV BOLUS (SEPSIS)
1500.0000 mL | Freq: Once | INTRAVENOUS | Status: AC
  Administered 2021-05-22: 1500 mL via INTRAVENOUS

## 2021-05-22 MED ORDER — VANCOMYCIN HCL 1250 MG/250ML IV SOLN
1250.0000 mg | Freq: Once | INTRAVENOUS | Status: AC
  Administered 2021-05-22: 1250 mg via INTRAVENOUS
  Filled 2021-05-22: qty 250

## 2021-05-22 MED ORDER — METOPROLOL SUCCINATE ER 25 MG PO TB24: 25.0000 mg | ORAL_TABLET | Freq: Every morning | ORAL | Status: DC

## 2021-05-22 MED ORDER — SERTRALINE HCL 50 MG PO TABS
25.0000 mg | ORAL_TABLET | Freq: Every morning | ORAL | Status: DC
  Filled 2021-05-22: qty 1

## 2021-05-22 MED ORDER — SODIUM CHLORIDE 0.9 % IV SOLN
2.0000 g | Freq: Once | INTRAVENOUS | Status: AC
  Administered 2021-05-22: 2 g via INTRAVENOUS
  Filled 2021-05-22: qty 2

## 2021-05-22 MED ORDER — GABAPENTIN 100 MG PO CAPS: 100.0000 mg | ORAL_CAPSULE | Freq: Two times a day (BID) | ORAL | Status: DC

## 2021-05-22 MED ORDER — HALOPERIDOL LACTATE 5 MG/ML IJ SOLN: 2.0000 mg | Freq: Four times a day (QID) | INTRAMUSCULAR | Status: DC | PRN

## 2021-05-22 MED ORDER — VANCOMYCIN HCL IN DEXTROSE 1-5 GM/200ML-% IV SOLN: 1000.0000 mg | INTRAVENOUS | Status: DC

## 2021-05-22 MED ORDER — SODIUM CHLORIDE 0.9% FLUSH: 3.0000 mL | Freq: Two times a day (BID) | INTRAVENOUS | Status: DC

## 2021-05-22 MED ORDER — ASPIRIN 81 MG PO CHEW: 81.0000 mg | CHEWABLE_TABLET | Freq: Every morning | ORAL | Status: DC

## 2021-05-22 NOTE — ED Notes (Signed)
Patient transported to CT 

## 2021-05-22 NOTE — Discharge Planning (Signed)
RNCM confirmed with Willaim Rayas of [facility] they will accept referral for home hospice.  Bevely Palmer will be in contact with family regarding disposition needs.

## 2021-05-22 NOTE — ED Notes (Signed)
Critical Lactic    3.3  reported [name] and Social worker

## 2021-05-22 NOTE — ED Triage Notes (Signed)
[facility] Unwitnessed fall with hematoma to forehead and bruising to bilateral shoulders. Currently tx for PNE, confused to baseline, hx CHF, A-fib, c-collar in place. Initial 02 sat 86% on RA, placed on 5 liters 02 [place] with sat 96-97%.

## 2021-05-22 NOTE — Sepsis Progress Note (Signed)
Notified bedside nurse of need to draw repeat lactic acid after completion of fluid bolus.

## 2021-05-22 NOTE — Progress Notes (Signed)
Orthopedic Tech Progress Note Patient Details:  [name] 10-08-1923 [id number]  Ortho Devices Type of Ortho Device: Sling immobilizer Ortho Device/Splint Location: LUE Ortho Device/Splint Interventions: Ordered, Application   Post Interventions Patient Tolerated: Well  Catlin Doria A Lynkin Saini 05/22/2021, 3:07 PM

## 2021-05-22 NOTE — ED Notes (Addendum)
[place]air Hugger in place for temp

## 2021-05-22 NOTE — Sepsis Progress Note (Signed)
Notified RN that the initial Lactate has not resulted yet.

## 2021-05-22 NOTE — ED Notes (Signed)
ED Provider at bedside. 

## 2021-05-22 NOTE — Progress Notes (Signed)
Pharmacy Antibiotic Note  [name] is a 87 y.o. female admitted on 05/22/2021 with pneumonia.  Pharmacy has been consulted for vancomycin and cefepime dosing.  Plan: Vancomycin 1250mg  x1 then 1000mg  IV 48h (eAUC 486; Cr 0.91mg /dL) Cefepime 2g IV q24h based on renal function Have asked MD to order MRSA PCR -Monitor renal function, clinical status, and antibiotic plan -Order vanc levels as necessary if remains on therapy  Height: 5' (152.4 cm) Weight: 54 kg (119 lb 0.8 oz) IBW/kg (Calculated) : 45.5  Temp (24hrs), Avg:93.5 F (34.2 C), Min:93.4 F (34.1 C), Max:93.6 F (34.2 C)  Recent Labs  Lab 05/22/21 0839  WBC 15.4*  CREATININE 0.91  LATICACIDVEN 3.3*    Estimated Creatinine Clearance: 26.6 mL/min (by C-G formula based on SCr of 0.91 mg/dL).    Allergies  Allergen Reactions   Tape Rash    Antimicrobials this admission: Vanc 9/29 >>  Cefepime 9/29 >>   Dose adjustments this admission: N/A  Microbiology results: 9/29 BCx:  9/29 UCx:   Thank you for allowing pharmacy to be a part of this patient's care.  Joetta Manners, PharmD, [facility] Emergency Medicine Clinical Pharmacist ED RPh Phone: [place]: [phone]
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
## 2021-05-22 NOTE — Discharge Planning (Signed)
Family are agreeable to utilize [facility] as [facility] has a relationship with (Confirmed with Otilio Carpen [phone]. DNR/DNI was confirmed. Family state they have no DME needs. PMT will follow up with family tomorrow after they speak with hospice.

## 2021-05-22 NOTE — ED Notes (Signed)
Washed hair, skin care/peri care done, bed changed. Patient assisted with turning, tolerated well.

## 2021-05-22 NOTE — Sepsis Progress Note (Signed)
ELink tracking the Code Sepsis. 

## 2021-05-22 NOTE — ED Notes (Signed)
SLP at bedside at this time.

## 2021-05-22 NOTE — Consult Note (Signed)
Consultation Note Date: 05/22/2021   Patient Name: [name]  DOB: November 20, 1923  MRN: [id number]  Age / Sex: 87 y.o., female  PCP: Yvonna Alanis, NP Referring Physician: Karmen Bongo, MD  Reason for Consultation: Establishing goals of care  HPI/Patient Profile: 87 y.o. female  with past medical history of significant of afib, chronic diastolic CHF, HTN, and lung/uterine/colon CA, dementia with behavioral disturbance presented to [facility] ED on 05/22/21 from [facility] after unwitnessed fall - unknown down time. Patient was admitted on 05/22/2021 with sepsis secondary to pneumonia and fall.  ED Course: Concern for sepsis, likely secondary to PNA.  Unwitnessed fall at [facility].  Hypothermic to 92 degrees.  Has been weaning off O2 since the last hospitalization, anxious without it. 84% without oxygen, improved on 2L.  Xrays pending for trauma.  Head CT negative.  CT spine with progressive widening, will d/w neurosurgery.  Has had 1500cc IVF, Vanc, Cefepime for HCAP.  Needs her head sutured.  Clinical Assessment and Goals of Care: I have reviewed medical records including EPIC notes, labs, and imaging. Received report from primary RN - no acute concerns.   Went to visit patient at bedside - daughter/Allison was present. Patient was lying in bed asleep - I did not attempt to wake her. No signs or non-verbal gestures of pain or discomfort noted. No respiratory distress, increased work of breathing, or secretions noted. She is on 2L O2 [place].   Met with daughter/Allison and her husband/Vic in ED conference room  to discuss diagnosis, prognosis, GOC, EOL wishes, disposition, and options.  I introduced Palliative Medicine as specialized medical care for people living with serious illness. It focuses on providing relief from the symptoms and stress of a serious illness. The goal is to improve quality of life for both the patient  and the family.  We discussed a brief life review of the patient as well as functional and nutritional status. Family review that patient had a significant fall in April 2022 and in May 2022 moved into assisted living (previously in independent living with her husband). After transition to assisted living, family feel that she has declined because she "doesn't have anything to do or any reason to her life." Family tell me that her last hospitalization was 3 weeks ago - she discharged to rehab but refused to participate. Prior to this she was eating well, but currently family state she "refuses to eat." They explain she has had an overall decline since May, but drastic decline over the last week. Therapeutic listening provided as family describe her as someone who loves to read, walk and watch Jeopardy; however, they explain she does not find joy in these activities anymore.   We discussed patient's current illness and what it means in the larger context of patient's on-going co-morbidities.  Natural disease trajectory and expectations at EOL were discussed. I attempted to elicit values and goals of care important to the patient. The difference between aggressive medical intervention and comfort care was considered in light of  the patient's goals of care. Education provided that patient's aspiration pneumonia would likely be recurring, which puts her at high risk of rehospitalization without hospice services at discharge.  Provided education and counseling at length on the philosophy and benefits of hospice care. Discussed that it offers a holistic approach to care in the setting of end-stage illness, and is about supporting the patient where they are allowing nature to take it's course. Discussed the hospice team includes RNs, physicians, social workers, and chaplains. They can provide personal care, support for the family, and help keep patient out of the hospital as well as assist with DME needs for home  hospice. Education provided on the difference between home vs residential hospice. Family are clear they would want the patient to return to [place] to be close to her husband for EOL. Family would like the patient to receive 24-48 hours of antibiotics and are interested in her discharging back to [place] with hospice. Long discussion was had on timing of discharge as they were not sure if they wanted discharge Friday, Saturday, or Monday. Reviewed discharge timing in context of her acute illness with possible decline in house (would recommend transfer sooner than later to ensure their goal to get patient back home close to her husband). Family are agreeable to utilize the hospice organization [facility] has a partnership with.   Family validate they would not want escalation of care if patient declined - would consider comfort measures in house at that time.  Advance directives, concepts specific to code status, artificial feeding and hydration, and rehospitalization were considered and discussed. Verified patient's DNR/DNI status.   Discussed with patient/family the importance of continued conversation with each other and the medical providers regarding overall plan of care and treatment options, ensuring decisions are within the context of the patient's values and GOCs.    Questions and concerns were addressed. The patient/family was encouraged to call with questions and/or concerns. PMT card was provided.  Primary Decision Maker: NEXT OF KIN - daughter/Allison    SUMMARY OF RECOMMENDATIONS   Continue current medical treatment without escalation of care Continue DNR/DNI as previously documented - durable DNR form completed and placed in shadow chart. Copy was made and will be scanned into Vynca/ACP tab Family would like 24-48 hours of antibiotics/current care and are hopeful for discharge back to [facility] Saturday 10/1 with hospice TOC notified and consulted for: home hospice  referral  PMT will continue to follow and support holistically   Code Status/Advance Care Planning: DNR  Palliative Prophylaxis:  Aspiration, Bowel Regimen, Delirium Protocol, Frequent Pain Assessment, Oral Care, and Turn Reposition  Additional Recommendations (Limitations, Scope, Preferences): Avoid Hospitalization, No Artificial Feeding, No Surgical Procedures, and No Tracheostomy  Psycho-social/Spiritual:  Desire for further Chaplaincy support:no Created space and opportunity for patient and family to express thoughts and feelings regarding patient's current medical situation.  Emotional support and therapeutic listening provided.  Prognosis:  < 6 weeks  Discharge Planning: [place] with Hospice      Primary Diagnoses: Present on Admission:  Sepsis due to pneumonia [facility])  Fall at nursing home  Essential hypertension  PAF (paroxysmal atrial fibrillation) ([place])  Dementia with behavioral disturbance   I have reviewed the medical record, interviewed the patient and family, and examined the patient. The following aspects are pertinent.  Past Medical History:  Diagnosis Date   Atrial fibrillation (HCC)    Benign paroxysmal vertigo    CHF (congestive heart failure) ([place])  Hearing loss    Hypertension    Malignant neoplasm of bronchus and lung (HCC)    Osteoporosis    Vitamin D deficiency    Social History   Socioeconomic History   Marital status: Married    Spouse name: Not on file   Number of children: Not on file   Years of education: Not on file   Highest education level: Not on file  Occupational History   Occupation: retired  Tobacco Use   Smoking status: Never   Smokeless tobacco: Never  Substance and Sexual Activity   Alcohol use: Never   Drug use: Never   Sexual activity: Not on file  Other Topics Concern   Not on file  Social History Narrative   Not on file   Social Determinants of Health   Financial Resource Strain: Not on  file  Food Insecurity: Not on file  Transportation Needs: Not on file  Physical Activity: Not on file  Stress: Not on file  Social Connections: Not on file   History reviewed. No pertinent family history. Scheduled Meds:  [START ON 05/23/2021] aspirin  81 mg Oral q AM   gabapentin  100 mg Oral BID   metoprolol succinate  25 mg Oral q AM   sertraline  25 mg Oral q AM   sodium chloride flush  3 mL Intravenous Q12H   Continuous Infusions:  [START ON 05/23/2021] ceFEPime (MAXIPIME) IV     lactated ringers 100 mL/hr at 05/22/21 1721   [START ON 05/24/2021] vancomycin     PRN Meds:.haloperidol lactate Medications Prior to Admission:  Prior to Admission medications   Medication Sig Start Date End Date Taking? Authorizing Provider  aspirin 81 MG chewable tablet Chew 81 mg by mouth in the morning.   Yes [provider]  Cholecalciferol (VITAMIN D-3) 25 MCG (1000 UT) CAPS Take 1,000 Units by mouth in the morning.   Yes [provider]  diclofenac Sodium (VOLTAREN) 1 % GEL Apply 2 g topically See admin instructions. Apply 2 grams to the back and shoulders THREE times a day   Yes [provider]  furosemide (LASIX) 20 MG tablet Take 20 mg by mouth in the morning.   Yes [provider]  gabapentin (NEURONTIN) 100 MG capsule Take 100 mg by mouth 2 (two) times daily.   Yes [provider]  hydrALAZINE (APRESOLINE) 10 MG tablet Take 10 mg by mouth daily as needed (if Systolic number is greater than 500 and/or Diastolic number is greater than 100).   Yes [provider]  lisinopril (ZESTRIL) 40 MG tablet Take 40 mg by mouth in the morning.   Yes [provider]  LORazepam (ATIVAN) 0.5 MG tablet Take 0.5 mg by mouth 2 (two) times daily as needed for anxiety.   Yes [provider]  metoprolol succinate (TOPROL-XL) 25 MG 24 hr tablet Take 25 mg by mouth in the morning.   Yes [provider]  Multiple Vitamins-Minerals (CENTRUM  ADULTS) CHEW Chew 2 tablets by mouth in the morning.   Yes [provider]  sertraline (ZOLOFT) 25 MG tablet Take 25 mg by mouth in the morning.   Yes [provider]  zinc oxide 20 % ointment Apply 1 application topically as needed (to buttocks after every incontinent episode- for redness).   Yes [provider]   Allergies  Allergen Reactions   Celebrex [Celecoxib] Other (See Comments)    "Allergic," per [facility]   Merthiolate Glycerite [Thimerosal] Other (See  Comments)    "Allergic," per MAR   Neomycin Other (See Comments)    "Allergic," per MAR   Penicillins Hives and Other (See Comments)    "Allergic," per MAR   Tape Rash and Other (See Comments)    "Allergic," per MAR   Review of Systems  Unable to perform ROS: Acuity of condition   Physical Exam Vitals and nursing note reviewed.  Constitutional:      General: She is not in acute distress.    Appearance: She is ill-appearing.  Pulmonary:     Effort: No respiratory distress.  Skin:    General: Skin is warm and dry.     Findings: Ecchymosis present.  Neurological:     Mental Status: She is lethargic, disoriented and confused.     Motor: Weakness present.  Psychiatric:        Cognition and Memory: Cognition is impaired. Memory is impaired.    Vital Signs: BP (!) 160/67   Pulse 92   Temp (!) 97.2 F (36.2 C)   Resp (!) 21   Ht 5' (1.524 m)   Wt 54 kg   SpO2 100%   BMI 23.25 kg/m  Pain Scale: Faces       SpO2: SpO2: 100 % O2 Device:SpO2: 100 % O2 Flow Rate: .O2 Flow Rate (L/min): 2 L/min  IO: Intake/output summary:  Intake/Output Summary (Last 24 hours) at 05/22/2021 1743 Last data filed at 05/22/2021 1130 Gross per 24 hour  Intake 1500.4 ml  Output --  Net 1500.4 ml    LBM:   Baseline Weight: Weight: 54 kg Most recent weight: Weight: 54 kg     Palliative Assessment/Data: PPS 20%     Time In: 1645 Time Out: 1800 Time Total: 75 minutes  Greater than 50%  of this time was  spent counseling and coordinating care related to the above assessment and plan.  Signed by: Lin Landsman, NP   Please contact Palliative Medicine Team phone at [phone] for questions and concerns.  For individual provider: See Shea Evans

## 2021-05-22 NOTE — ED Provider Notes (Signed)
[facility] EMERGENCY DEPARTMENT Provider Note   CSN: [id number] Arrival date & time: 05/22/21  6962     History Chief Complaint  Patient presents with   Lytle Michaels    [name] is a 87 y.o. female.  HPI     87 year old female with a history of atrial fibrillation, CHF, hypertension, malignant neoplasm of bronchus and lung, hearing loss, admission to the hospital at the end of August for community-acquired pneumonia with continued difficulty weaning oxygen and associated anxiety as an outpatient, with recent diagnosis for recurrent pneumonia per her facility, presents with concern for unwitnessed fall.  History is limited by both history of cognitive issues as well as significant hearing loss.  The patient is unable to answer any questions regarding her symptoms.  Per EMS, patient was no longer on oxygen, and had an unwitnessed fall today.  Initially she arrived as a level trauma with concern that she was on blood thinners, however was noted to not be on any anticoagulation on further evaluation.  Daughter reports she had a good day Tuesday, but yesterday did not want to eat anything, not acting herself. Seems confused today.      Past Medical History:  Diagnosis Date   Atrial fibrillation (HCC)    Benign paroxysmal vertigo    CHF (congestive heart failure) (HCC)    Hearing loss    Hypertension    Malignant neoplasm of bronchus and lung ([place])    Osteoporosis    Vitamin D deficiency     Patient Active Problem List   Diagnosis Date Noted   Sepsis due to pneumonia ([place]) 05/22/2021     OB History   No obstetric history on file.     No family history on file.     Home Medications Prior to Admission medications   Medication Sig Start Date End Date Taking? Authorizing Provider  aspirin 81 MG chewable tablet Chew 81 mg by mouth in the morning.   Yes [provider]  Cholecalciferol (VITAMIN D-3) 25 MCG (1000 UT) CAPS Take 1,000 Units by  mouth in the morning.   Yes [provider]  diclofenac Sodium (VOLTAREN) 1 % GEL Apply 2 g topically See admin instructions. Apply 2 grams to the back and shoulders THREE times a day   Yes [provider]  furosemide (LASIX) 20 MG tablet Take 20 mg by mouth in the morning.   Yes [provider]  gabapentin (NEURONTIN) 100 MG capsule Take 100 mg by mouth 2 (two) times daily.   Yes [provider]  hydrALAZINE (APRESOLINE) 10 MG tablet Take 10 mg by mouth daily as needed (if Systolic number is greater than 952 and/or Diastolic number is greater than 100).   Yes [provider]  lisinopril (ZESTRIL) 40 MG tablet Take 40 mg by mouth in the morning.   Yes [provider]  LORazepam (ATIVAN) 0.5 MG tablet Take 0.5 mg by mouth 2 (two) times daily as needed for anxiety.   Yes [provider]  metoprolol succinate (TOPROL-XL) 25 MG 24 hr tablet Take 25 mg by mouth in the morning.   Yes [provider]  Multiple Vitamins-Minerals (CENTRUM ADULTS) CHEW Chew 2 tablets by mouth in the morning.   Yes [provider]  sertraline (ZOLOFT) 25 MG tablet Take 25 mg by mouth in the morning.   Yes [provider]  zinc oxide 20 % ointment Apply 1 application topically as needed (to buttocks after every incontinent episode-  for redness).   Yes [provider]    Allergies    Celebrex [celecoxib], Merthiolate glycerite [thimerosal], Neomycin, Penicillins, and Tape  Review of Systems   Review of Systems  Unable to perform ROS: Other   Physical Exam Updated Vital Signs BP (!) 116/59   Pulse 75   Temp 97.9 F (36.6 C)   Resp (!) 22   Ht 5' (1.524 m)   Wt 54 kg   SpO2 97%   BMI 23.25 kg/m   Physical Exam Vitals and nursing note reviewed.  Constitutional:      General: She is not in acute distress.    Appearance: She is well-developed. She is ill-appearing. She is not diaphoretic.  HENT:     Head:  Normocephalic.     Comments: Right periorbital contusion, 4cm laceration to right forehead  Eyes:     Conjunctiva/sclera: Conjunctivae normal.  Cardiovascular:     Rate and Rhythm: Normal rate and regular rhythm.     Heart sounds: Normal heart sounds. No murmur heard.   No friction rub. No gallop.  Pulmonary:     Effort: Pulmonary effort is normal. Tachypnea present. No respiratory distress.     Breath sounds: Normal breath sounds. No wheezing or rales.  Abdominal:     General: There is no distension.     Palpations: Abdomen is soft.     Tenderness: There is no abdominal tenderness. There is no guarding.  Musculoskeletal:        General: No tenderness.     Cervical back: Normal range of motion.  Skin:    General: Skin is warm and dry.     Findings: Bruising (left shoulder, bilateral knees) present. No erythema or rash.  Neurological:     Mental Status: She is alert and oriented to person, place, and time.     Comments: Hearing deficit and some confusion limit evaluation, lifting arms with visual commands, lifting legs, smiling, no droop, no weakness, eyes tracking normally    ED Results / Procedures / Treatments   Labs (all labs ordered are listed, but only abnormal results are displayed) Labs Reviewed  LACTIC ACID, PLASMA - Abnormal; Notable for the following components:      Result Value   Lactic Acid, Venous 3.3 (*)    All other components within normal limits  COMPREHENSIVE METABOLIC PANEL - Abnormal; Notable for the following components:   Sodium 134 (*)    Chloride 86 (*)    CO2 37 (*)    Glucose, Bld 130 (*)    Total Protein 5.2 (*)    Albumin 2.9 (*)    Total Bilirubin 1.3 (*)    GFR, Estimated 58 (*)    All other components within normal limits  CBC WITH DIFFERENTIAL/PLATELET - Abnormal; Notable for the following components:   WBC 15.4 (*)    Neutro Abs 13.0 (*)    Monocytes Absolute 1.4 (*)    Abs Immature Granulocytes 0.11 (*)    All other components  within normal limits  URINALYSIS, ROUTINE W REFLEX MICROSCOPIC - Abnormal; Notable for the following components:   APPearance HAZY (*)    Ketones, ur 20 (*)    All other components within normal limits  RESP PANEL BY RT-PCR (FLU A&B, COVID) ARPGX2  CULTURE, BLOOD (ROUTINE X 2)  CULTURE, BLOOD (ROUTINE X 2)  URINE CULTURE  MRSA NEXT GEN BY PCR, NASAL  LACTIC ACID, PLASMA  PROTIME-INR  APTT  PROCALCITONIN    EKG None  Radiology DG Knee 2 Views Left  Result Date: 05/22/2021 CLINICAL DATA:  Status post fall. Forehead hematoma. Bilateral shoulder bruising. EXAM: LEFT KNEE - 1-2 VIEW COMPARISON:  None. FINDINGS: Left total knee prosthesis is well seated without periprosthetic fracture or lucency. Soft tissues are unremarkable. IMPRESSION: No acute abnormality of the left knee. Electronically Signed   By: Miachel Roux M.D.   On: 05/22/2021 11:51   DG Knee 2 Views Right  Result Date: 05/22/2021 CLINICAL DATA:  Unwitnessed fall. Forehead hematoma. Bilateral shoulder bruising. EXAM: RIGHT KNEE - 1-2 VIEW COMPARISON:  None. FINDINGS: Diffuse osteopenia. Chondrocalcinosis present in the medial and lateral compartments. No fracture or dislocation. Soft tissues are unremarkable. Atherosclerotic changes seen throughout visualized arterial segments. IMPRESSION: No acute abnormality of the right knee. Electronically Signed   By: Miachel Roux M.D.   On: 05/22/2021 11:53   CT HEAD WO CONTRAST (5MM)  Result Date: 05/22/2021 CLINICAL DATA:  Fall EXAM: CT HEAD WITHOUT CONTRAST TECHNIQUE: Contiguous axial images were obtained from the base of the skull through the vertex without intravenous contrast. COMPARISON:  CT head 12/03/2020 FINDINGS: Brain: There is no acute intracranial hemorrhage, extra-axial fluid collection, or acute infarct. There is unchanged mild global parenchymal volume loss and moderate chronic white matter microangiopathy. The ventricles are stable in size. There is no mass lesion. There is  no midline shift. Vascular: No hyperdense vessel or unexpected calcification. Skull: There is a moderate size right frontal scalp hematoma without underlying calvarial fracture. There are no suspicious lesions. Sinuses/Orbits: The paranasal sinuses are clear. Bilateral lens implants are in place. The globes and orbits are otherwise unremarkable. Other: The patient is status post right canal wall down mastoidectomy. Fluid opacification of the residual mastoid air cells and middle ear cavity has worsened since 12/03/2020. There is advanced degenerative change of the temporomandibular joints. IMPRESSION: 1. No acute intracranial hemorrhage. 2. Moderate frontal scalp hematoma without underlying calvarial fracture. 3. Status post right canal wall down mastoidectomy with increased opacification of the residual mastoid air cells and middle ear cavity. Correlate with any symptoms and consider dedicated outpatient imaging of the temporal bone as indicated. Electronically Signed   By: Valetta Mole M.D.   On: 05/22/2021 10:08   CT Cervical Spine Wo Contrast  Result Date: 05/22/2021 CLINICAL DATA:  Trauma EXAM: CT CERVICAL SPINE WITHOUT CONTRAST TECHNIQUE: Multidetector CT imaging of the cervical spine was performed without intravenous contrast. Multiplanar CT image reconstructions were also generated. COMPARISON:  CT cervical spine 12/03/2020 FINDINGS: Alignment: There is exaggerated cervical spine lordosis, similar to the prior study. There is unchanged grade 1 anterolisthesis of C4 on C5, C5 on C6, C6 on C7, and C7 on T1, unchanged and likely degenerative in nature. There is slight asymmetric widening of the left C2-C3 facet joint, increased since the prior study. There is no evidence of traumatic malalignment. Skull base and vertebrae: The bones are diffusely demineralized. Skull base alignment is maintained. Vertebral body heights are preserved. There is no evidence of acute fracture. Soft tissues and spinal canal: No  prevertebral fluid or swelling. No visible canal hematoma. Disc levels: There is multilevel intervertebral disc space narrowing with associated uncovertebral and facet arthropathy, overall similar to the prior study. Facet arthropathy is most advanced at C3-C4 and C4-C5, worse on the left. The osseous spinal canal is patent. Upper chest: An area of scarring in the medial right apex is unchanged going back to 2018. There are bilateral pleural effusions, incompletely imaged. Other: There is a 2.1  cm left thyroid nodule. 2.1 cm incidental left thyroid nodule. Recommend thyroid US if clinically warranted given patient age. Reference: J Am Coll Radiol. 2015 Feb;12(2): 143-50 IMPRESSION: 1. No acute fracture or traumatic malalignment of the cervical spine. 2. Slight asymmetric widening of the left C2-C3 facet joint is increased since 12/03/2020 and may be due to progressive degenerative change or related to the recent fall. 3. Multilevel spondylosis and spondylolisthesis as detailed above, not significantly changed. 4. Bilateral pleural effusions, incompletely imaged. Electronically Signed   By: Valetta Mole M.D.   On: 05/22/2021 10:16   DG Chest Port 1 View  Result Date: 05/22/2021 CLINICAL DATA:  Fall, confusion, and weakness.  Hypoxia. EXAM: PORTABLE CHEST 1 VIEW COMPARISON:  CTA chest and chest x-ray dated April 24, 2021. FINDINGS: Unchanged moderate cardiomegaly. Normal pulmonary vascularity. Unchanged small bilateral pleural effusions and left basilar atelectasis. Similar pleuroparenchymal scarring at the right lung base. No pneumothorax. No acute osseous abnormality. IMPRESSION: 1. Unchanged small bilateral pleural effusions and left basilar atelectasis. Electronically Signed   By: Titus Dubin M.D.   On: 05/22/2021 09:22   DG Shoulder Left  Result Date: 05/22/2021 CLINICAL DATA:  Bilateral shoulder bruising status post fall. EXAM: LEFT SHOULDER - 2+ VIEW COMPARISON:  None. FINDINGS: Diffuse  osteopenia. Mild deformity of the lateral head of the clavicle suspicious for nondisplaced fracture. There is otherwise no acute fracture or dislocation. Visualized soft tissues are unremarkable. IMPRESSION: Irregularity of the lateral clavicular head near the [facility] joint is suspicious for fracture. Please correlate for focal tenderness. Electronically Signed   By: Miachel Roux M.D.   On: 05/22/2021 11:54    Procedures .Critical Care Performed by: Gareth Morgan, MD Authorized by: Gareth Morgan, MD   Critical care provider statement:    Critical care time (minutes):  45   Critical care was time spent personally by me on the following activities:  Discussions with consultants, evaluation of patient's response to treatment, examination of patient, ordering and performing treatments and interventions, ordering and review of laboratory studies, ordering and review of radiographic studies, pulse oximetry, re-evaluation of patient's condition, obtaining history from patient or surrogate and review of old charts .Marland KitchenLaceration Repair  Date/Time: 05/22/2021 12:45 PM Performed by: Gareth Morgan, MD Authorized by: Gareth Morgan, MD   Consent:    Consent obtained:  Emergent situation Universal protocol:    Patient identity confirmed:  Arm band Anesthesia:    Anesthesia method:  Local infiltration   Local anesthetic:  Lidocaine 1% w/o epi Laceration details:    Location:  Face   Face location:  Forehead   Length (cm):  4 Pre-procedure details:    Preparation:  Patient was prepped and draped in usual sterile fashion Treatment:    Area cleansed with:  Chlorhexidine   Amount of cleaning:  Standard   Irrigation solution:  Sterile saline Skin repair:    Repair method:  Sutures   Suture size:  6-0   Suture material:  Fast-absorbing gut   Suture technique:  Simple interrupted Approximation:    Approximation:  Close Repair type:    Repair type:  Simple Post-procedure details:    Procedure  completion:  Tolerated well, no immediate complications   Medications Ordered in ED Medications  vancomycin (VANCOREADY) IVPB 1250 mg/250 mL (1,250 mg Intravenous New Bag/Given 05/22/21 1202)  vancomycin (VANCOCIN) IVPB 1000 mg/200 mL premix (has no administration in time range)  ceFEPIme (MAXIPIME) 2 g in sodium chloride 0.9 % 100 mL IVPB (has no administration in  time range)  aspirin chewable tablet 81 mg (has no administration in time range)  LORazepam (ATIVAN) tablet 0.5 mg (has no administration in time range)  sertraline (ZOLOFT) tablet 25 mg (has no administration in time range)  gabapentin (NEURONTIN) capsule 100 mg (has no administration in time range)  sodium chloride flush (NS) 0.9 % injection 3 mL (has no administration in time range)  lactated ringers infusion (has no administration in time range)  metoprolol succinate (TOPROL-XL) 24 hr tablet 25 mg (has no administration in time range)  lactated ringers bolus 1,500 mL (0 mLs Intravenous Stopped 05/22/21 1130)  ceFEPIme (MAXIPIME) 2 g in sodium chloride 0.9 % 100 mL IVPB (0 g Intravenous Stopped 05/22/21 1050)  lidocaine (PF) (XYLOCAINE) 1 % injection (30 mLs  Given 05/22/21 1130)    ED Course  I have reviewed the triage vital signs and the nursing notes.  Pertinent labs & imaging results that were available during my care of the patient were reviewed by me and considered in my medical decision making (see chart for details).    MDM Rules/Calculators/A&P                           87 year old female with a history of atrial fibrillation, CHF, hypertension, malignant neoplasm of bronchus and lung, hearing loss, admission to the hospital at the end of August for community-acquired pneumonia with continued difficulty weaning oxygen and associated anxiety as an outpatient, with recent diagnosis for recurrent pneumonia per her facility, presents with concern for unwitnessed fall.  History is limited by both history of cognitive issues  as well as significant hearing loss.  Canceled level trauma in setting of not being on anticoagulation, recent pneumonia/hypoxia issue and not meeting leveling criteria.   She was found to be hypothermic and hypoxic to upper 80s and placed on 2L [place] (most recent visit 9/19 with dr states she was also on 2L with plan to wean)--Given hypothermia, concern for pneumonia by history for EMS, ordered sepsis evaluation and antibiotics to treat HCAP.   CT head with prior mastoidectomy, consider outpatient imaging of temporal bone as indicated, no acute intrcranail abnormalities.  CT with asymmetric widening of left C2-C3 facet joint, progressive denerative changes versus fall.  Discussed with [facility]U who does not recommend cervical collar and states she can follow up as needed. XR shoulder with questionable clavicle fracture, but does not seem to be point tender over location and is using arm and doubt fracture.   CXR shows pleural effusions, read as atelectasis on left however may be pneumonia in setting of leukocytosis to 15, hypothermia.  Lactate 3.3. Bair hugger placed, given LR.  Will admit with concern for sepsis likely secondary to pneumonia with left sided findings on CXR.  Dr. Lorin Mercy to admit and is discussing goals of care.    Final Clinical Impression(s) / ED Diagnoses Final diagnoses:  Fall, initial encounter  Contusion of face, initial encounter  Facial laceration, initial encounter  Sepsis without acute organ dysfunction, due to unspecified organism ([place])  Hypothermia, initial encounter    Rx / DC Orders ED Discharge Orders     None        Gareth Morgan, MD 05/22/21 1248

## 2021-05-22 NOTE — Evaluation (Signed)
Clinical/Bedside Swallow Evaluation Patient Details  Name: [name] MRN: [id number] Date of Birth: 1923/12/29  Today's Date: 05/22/2021 Time: SLP Start Time (ACUTE ONLY): 2951 SLP Stop Time (ACUTE ONLY): 1627 SLP Time Calculation (min) (ACUTE ONLY): 33 min  Past Medical History:  Past Medical History:  Diagnosis Date   Atrial fibrillation (HCC)    Benign paroxysmal vertigo    CHF (congestive heart failure) (HCC)    Hearing loss    Hypertension    Malignant neoplasm of bronchus and lung (HCC)    Osteoporosis    Vitamin D deficiency    Past Surgical History: History reviewed. No pertinent surgical history. HPI:  [name] is a 87 y.o. female with medical history significant of afib; chronic diastolic CHF; HTN; and lung/uterine/colon CA presenting with a fall.  She was previously hospitalized from 8/31-9/3 for CAP and discharged to SNF.   Since prior hospitalization, she has continued to decline.  She was discharged from SNF rehab due to lack of progression/participation but was unable to return to ALF and so has remained in SNF. Has had poor PO intake, concern for aspiration per chart review.    Assessment / Plan / Recommendation  Clinical Impression  Pt presents with concern for components of pharyngoesophageal dysphagia. Isolated coughing was exhibited across all POs concerning for reduced airway protection (thin liquids, nectar thick, honey thick, and puree). Coughing was delayed in nature though appeared most frequent with thinner textures. Pt did have multiple swallows across consistencies suggesting reduced swallowing efficiency. Consistent belching also noted with likely esophageal involvement. Per discussion with MD, will continue NPO this date with reassessment next date including family, care team discussion for goals of care. SLP to continue to follow for PO readiness and goals of care (diet with known aspiration risk vs instrumental assessment to guide diet recs if  within goals of care).  SLP Visit Diagnosis: Dysphagia, unspecified (R13.10)    Aspiration Risk  Moderate aspiration risk;Risk for inadequate nutrition/hydration    Diet Recommendation NPO    Medication Administration: Via alternative means    Other  Recommendations Oral Care Recommendations: Oral care QID    Recommendations for follow up therapy are one component of a multi-disciplinary discharge planning process, led by the attending physician.  Recommendations may be updated based on patient status, additional functional criteria and insurance authorization.  Follow up Recommendations Other (comment) (pending goals of care)      Frequency and Duration min 2x/week  1 week       Prognosis Prognosis for Safe Diet Advancement: Fair Barriers to Reach Goals: Time post onset;Severity of deficits      Swallow Study   General Date of Onset: 05/22/21 HPI: [name] is a 87 y.o. female with medical history significant of afib; chronic diastolic CHF; HTN; and lung/uterine/colon CA presenting with a fall.  She was previously hospitalized from 8/31-9/3 for CAP and discharged to SNF.   Since prior hospitalization, she has continued to decline.  She was discharged from SNF rehab due to lack of progression/participation but was unable to return to ALF and so has remained in SNF. Has had poor PO intake, concern for aspiration per chart review. Type of Study: Bedside Swallow Evaluation Previous Swallow Assessment: none on file Diet Prior to this Study: NPO Temperature Spikes Noted: No Respiratory Status: Nasal cannula History of Recent Intubation: No Behavior/Cognition: Alert;Cooperative;Requires cueing;Other (Comment) (very HOH) Oral Cavity Assessment: Dry Oral Care Completed by SLP: Yes Oral Cavity - Dentition: Missing  dentition Vision: Functional for self-feeding Self-Feeding Abilities: Needs assist Patient Positioning: Upright in bed Baseline Vocal Quality: Normal Volitional  Cough: Weak Volitional Swallow: Unable to elicit    Oral/Motor/Sensory Function Overall Oral Motor/Sensory Function: Generalized oral weakness   Ice Chips Ice chips: Not tested (declined, daughter states will not do ice)   Thin Liquid Thin Liquid: Impaired Presentation: Cup Oral Phase Functional Implications: Prolonged oral transit Pharyngeal  Phase Impairments: Suspected delayed Swallow;Decreased hyoid-laryngeal movement;Multiple swallows;Cough - Delayed    Nectar Thick Nectar Thick Liquid: Impaired Presentation: Cup Pharyngeal Phase Impairments: Suspected delayed Swallow;Multiple swallows;Cough - Delayed;Decreased hyoid-laryngeal movement   Honey Thick Honey Thick Liquid: Impaired Presentation: Cup Pharyngeal Phase Impairments: Suspected delayed Swallow;Multiple swallows;Decreased hyoid-laryngeal movement;Cough - Delayed   Puree Puree: Impaired Presentation: Spoon Pharyngeal Phase Impairments: Suspected delayed Swallow;Multiple swallows;Decreased hyoid-laryngeal movement;Cough - Delayed   Solid     Solid: Not tested      Hayden Rasmussen MA, CCC-SLP Acute Rehabilitation Services   05/22/2021,4:42 PM

## 2021-05-23 DIAGNOSIS — A419 Sepsis, unspecified organism: Secondary | ICD-10-CM | POA: Diagnosis not present

## 2021-05-23 DIAGNOSIS — Z515 Encounter for palliative care: Secondary | ICD-10-CM | POA: Diagnosis not present

## 2021-05-23 DIAGNOSIS — S0083XA Contusion of other part of head, initial encounter: Secondary | ICD-10-CM | POA: Diagnosis not present

## 2021-05-23 DIAGNOSIS — J189 Pneumonia, unspecified organism: Secondary | ICD-10-CM | POA: Diagnosis not present

## 2021-05-23 DIAGNOSIS — G309 Alzheimer's disease, unspecified: Secondary | ICD-10-CM | POA: Diagnosis not present

## 2021-05-23 LAB — BASIC METABOLIC PANEL
Anion gap: 10 (ref 5–15)
BUN: 13 mg/dL (ref 8–23)
CO2: 33 mmol/L — ABNORMAL HIGH (ref 22–32)
Calcium: 8.3 mg/dL — ABNORMAL LOW (ref 8.9–10.3)
Chloride: 89 mmol/L — ABNORMAL LOW (ref 98–111)
Creatinine, Ser: 0.69 mg/dL (ref 0.44–1.00)
GFR, Estimated: >60 mL/min (ref >60)
Glucose, Bld: 88 mg/dL (ref 70–99)
Potassium: 3.9 mmol/L (ref 3.5–5.1)
Sodium: 132 mmol/L — ABNORMAL LOW (ref 135–145)

## 2021-05-23 LAB — CBC
HCT: 30.3 % — ABNORMAL LOW (ref 36.0–46.0)
Hemoglobin: 10.5 g/dL — ABNORMAL LOW (ref 12.0–15.0)
MCH: 31.9 pg (ref 26.0–34.0)
MCHC: 34.7 g/dL (ref 30.0–36.0)
MCV: 92.1 fL (ref 80.0–100.0)
Platelets: 185 10*3/uL (ref 150–400)
RBC: 3.29 MIL/uL — ABNORMAL LOW (ref 3.87–5.11)
RDW: 13.8 % (ref 11.5–15.5)
WBC: 9.6 10*3/uL (ref 4.0–10.5)
nRBC: 0 % (ref 0.0–0.2)

## 2021-05-23 LAB — URINE CULTURE: Culture: NO GROWTH

## 2021-05-23 LAB — MRSA NEXT GEN BY PCR, NASAL: MRSA by PCR Next Gen: NOT DETECTED

## 2021-05-23 MED ORDER — LORAZEPAM 2 MG/ML IJ SOLN
1.0000 mg | INTRAMUSCULAR | Status: DC | PRN
  Administered 2021-05-23: 1 mg via INTRAVENOUS
  Filled 2021-05-23 (×2): qty 1

## 2021-05-23 MED ORDER — LORAZEPAM 1 MG PO TABS: 1.0000 mg | ORAL_TABLET | ORAL | Status: DC | PRN

## 2021-05-23 MED ORDER — POLYVINYL ALCOHOL 1.4 % OP SOLN: 1.0000 [drp] | Freq: Four times a day (QID) | OPHTHALMIC | Status: DC | PRN

## 2021-05-23 MED ORDER — HALOPERIDOL LACTATE 5 MG/ML IJ SOLN: 2.0000 mg | Freq: Four times a day (QID) | INTRAMUSCULAR | Status: DC | PRN

## 2021-05-23 MED ORDER — HALOPERIDOL LACTATE 2 MG/ML PO CONC
2.0000 mg | Freq: Four times a day (QID) | ORAL | Status: DC | PRN
  Filled 2021-05-23: qty 1

## 2021-05-23 MED ORDER — BIOTENE DRY MOUTH MT LIQD
15.0000 mL | Freq: Two times a day (BID) | OROMUCOSAL | Status: DC
  Administered 2021-05-23 – 2021-05-24 (×3): 15 mL via TOPICAL

## 2021-05-23 MED ORDER — LORAZEPAM 2 MG/ML PO CONC: 1.0000 mg | ORAL | Status: DC | PRN

## 2021-05-23 MED ORDER — ACETAMINOPHEN 325 MG PO TABS: 650.0000 mg | ORAL_TABLET | Freq: Four times a day (QID) | ORAL | Status: DC | PRN

## 2021-05-23 MED ORDER — CHLORHEXIDINE GLUCONATE CLOTH 2 % EX PADS
6.0000 | MEDICATED_PAD | Freq: Every day | CUTANEOUS | Status: DC
  Administered 2021-05-23: 6 via TOPICAL

## 2021-05-23 MED ORDER — ONDANSETRON 4 MG PO TBDP: 4.0000 mg | ORAL_TABLET | Freq: Four times a day (QID) | ORAL | Status: DC | PRN

## 2021-05-23 MED ORDER — HALOPERIDOL 1 MG PO TABS
2.0000 mg | ORAL_TABLET | Freq: Four times a day (QID) | ORAL | Status: DC | PRN
  Filled 2021-05-23: qty 2

## 2021-05-23 MED ORDER — ACETAMINOPHEN 650 MG RE SUPP: 650.0000 mg | Freq: Four times a day (QID) | RECTAL | Status: DC | PRN

## 2021-05-23 MED ORDER — ONDANSETRON HCL 4 MG/2ML IJ SOLN: 4.0000 mg | Freq: Four times a day (QID) | INTRAMUSCULAR | Status: DC | PRN

## 2021-05-23 MED ORDER — MORPHINE SULFATE (PF) 2 MG/ML IV SOLN
2.0000 mg | INTRAVENOUS | Status: DC | PRN
  Administered 2021-05-23 – 2021-05-24 (×2): 2 mg via INTRAVENOUS
  Filled 2021-05-23 (×2): qty 1

## 2021-05-23 MED ORDER — GLYCOPYRROLATE 0.2 MG/ML IJ SOLN: 0.2000 mg | INTRAMUSCULAR | Status: DC | PRN

## 2021-05-23 MED ORDER — GLYCOPYRROLATE 1 MG PO TABS
1.0000 mg | ORAL_TABLET | ORAL | Status: DC | PRN
  Filled 2021-05-23: qty 1

## 2021-05-23 NOTE — Care Management (Addendum)
Spoke to [employer] at [facility] 336 [phone]. Patient recently moved to their skilled side. Aggie Hacker has talked to daughter Ebony Hail and can accept patient back tomorrow if medically ready and receives FL2 and DC summary.   Number for nurse to call report at discharge is [phone] and ask for nursing office ext [phone]. Fax number is [phone].   Patient's oxygen is provided by [facility] through [place], they cannot provide a portable tank for discharge. Aggie Hacker prefers patient returns by [facility].    NCM called patient's daughter Ebony Hail and left voicemail.   Patient will be returning to [facility] room 4    1240 Received message from palliative care daughter now wants [facility]. [place] with [facility] aware and will review.  NCM has left voicemail for daughter.  Also called [place] with [facility] [place] and she is aware.    1620 Patient approved for [facility] hopeful for bed tomorrow , Aggie Hacker at [facility] aware

## 2021-05-23 NOTE — NC FL2 (Signed)
[place] LEVEL OF CARE SCREENING TOOL     IDENTIFICATION  Patient Name: [name] Birthdate: 04-25-1924 Sex: female Admission Date (Current Location): 05/22/2021  [facility] and [place] Number:  Herbalist and Address:  [facility], [place] [address] [place], [place] [address]      Provider Number: [id number]  Attending Physician Name and Address:  Nita Sells, MD  Relative Name and Phone Number:       Current Level of Care: Hospital Recommended Level of Care: [place] Prior Approval Number:    Date Approved/Denied:   PASRR Number: [id number]  Discharge Plan: SNF    Current Diagnoses: Patient Active Problem List   Diagnosis Date Noted   Sepsis due to pneumonia ([place]) 05/22/2021   Fall at nursing home 05/22/2021   Goals of care, counseling/discussion 05/22/2021   Essential hypertension 05/22/2021   PAF (paroxysmal atrial fibrillation) ([place]) 05/22/2021   Dementia with behavioral disturbance 05/22/2021    Orientation RESPIRATION BLADDER Height & Weight     Self  O2 (2 liters [place] continuous) Indwelling catheter Weight: 54 kg Height:  5' (152.4 cm)  BEHAVIORAL SYMPTOMS/MOOD NEUROLOGICAL BOWEL NUTRITION STATUS        Diet  AMBULATORY STATUS COMMUNICATION OF NEEDS Skin     Verbally Normal                       Personal Care Assistance Level of Assistance              Functional Limitations Info  Hearing, Sight Sight Info: Adequate Hearing Info: Adequate      SPECIAL CARE FACTORS FREQUENCY                       Contractures Contractures Info: Present    Additional Factors Info  Code Status, Allergies Code Status Info: DNR Allergies Info: celebrex, merthiolate, neomycin, pencillins, tape           Current Medications (05/23/2021):  This is the current hospital active medication list Current Facility-Administered Medications  Medication Dose Route Frequency  Provider Last Rate Last Admin   aspirin chewable tablet 81 mg  81 mg Oral q AM Karmen Bongo, MD       ceFEPIme (MAXIPIME) 2 g in sodium chloride 0.9 % 100 mL IVPB  2 g Intravenous Q24H Karmen Bongo, MD 200 mL/hr at 05/23/21 0829 2 g at 05/23/21 6812   Chlorhexidine Gluconate Cloth 2 % PADS 6 each  6 each Topical Daily Karmen Bongo, MD   6 each at 05/23/21 0831   gabapentin (NEURONTIN) capsule 100 mg  100 mg Oral BID Karmen Bongo, MD       haloperidol lactate (HALDOL) injection 2 mg  2 mg Intravenous Q6H PRN Karmen Bongo, MD       lactated ringers infusion   Intravenous Continuous Karmen Bongo, MD 100 mL/hr at 05/22/21 1721 New Bag at 05/22/21 1721   metoprolol succinate (TOPROL-XL) 24 hr tablet 25 mg  25 mg Oral q AM Karmen Bongo, MD       sertraline (ZOLOFT) tablet 25 mg  25 mg Oral q AM Karmen Bongo, MD       sodium chloride flush (NS) 0.9 % injection 3 mL  3 mL Intravenous Lillia Mountain, MD       Derrill Memo ON 05/24/2021] vancomycin (VANCOCIN) IVPB 1000 mg/200 mL premix  1,000 mg Intravenous Q48H Karmen Bongo, MD  Discharge Medications: Please see discharge summary for a list of discharge medications.  Relevant Imaging Results:  Relevant Lab Results:   Additional Information [id number], Returning to [facility] for  end of life care , [facility] for hospice , oxygen 2 l [place]  Oaklynn Stierwalt, Edson Snowball, RN

## 2021-05-23 NOTE — Progress Notes (Signed)
Daily Progress Note   Patient Name: [name]       Date: 05/23/2021 DOB: 1923/10/01  Age: 87 y.o. MRN#: [id number] Attending Physician: Nita Sells, MD Primary Care Physician: Yvonna Alanis, NP Admit Date: 05/22/2021  Reason for Consultation/Follow-up: Disposition and Establishing goals of care  Subjective: Chart review performed. Received report from primary RN - no acute concerns. RN reports patient has been restless, confused,  pulling at lines, and now requiring mitts.   Went to visit patient at bedside - daughter/Allison and son in law/Vic present. Patient was lying in bed - she is intermittently awake during my visit, but keeps her eyes closed and responses are inappropriate. She appears restless, mitts are in place. No respiratory distress, increased work of breathing, or secretions noted.    Emotional support provided to family. Reviewed patient's interval history with family since meeting yesterday, including SLP eval/recommendations. Discussed comfort feeds in context of known aspiration risk, as well as risk for recurrent aspiration pneumonia. After discussion, family express relief they "made the right decision this morning" not to pursue swallow study.   Per family's request, discussed at length disposition: return to [facility] today or tomorrow as planned with hospice vs remain in house until Monday vs transfer to residential hospice facility. Family want patient to return to [facility] to be close to her husband; however, repeat they do not feel she would get the care she needs there. Education provided that it is anticipated patient will continue to decline and may not be stable for transfer back to [facility] if they wait until Monday. Prognostication was  reviewed. After at length discussion, Ebony Hail called patient's husband to discuss with him. Again reviewed options and concerns as outlined above. Recommendation given for patient to return to [place] or residential hospice facility since goal is for patient not to pass away in house. Patient's husband is clear he wants her transferred to [facility]. He states, "she has not wanted to be here for a long time, let her go" - speaking to not wanting to prolong her life on this [place], let her pass peacefully. He feels [facility] will be able to meet her EOL needs over [place] with hospice.   We talked about transition to comfort measures in house and what  that would entail inclusive of medications to control pain, dyspnea, agitation, nausea, itching, and hiccups. We discussed stopping all unnecessary measures such as blood draws, needle sticks, oxygen, antibiotics, CBGs/insulin, cardiac monitoring, IVF, and frequent vital signs. Patient's husband is agreeable to transition the patient to full comfort care today and daughter is supportive of his decision.   Questions answered in regards to visitation and how patient's husband can visit at the hospital and at [facility].   Throughout visit, daughter was understandably overwhelmed with situation. Provided emotional support, therapeutic listening, as well as time and space for her to express thoughts and feelings related to current situation throughout discussion. Multiple topics were repeatedly discussed to allow time for her to process information.   Family requested I swab patient's mouth prior to leaving. Education provided on how to do this. I attempted to swab patient's mouth, but she refused by closing her lips tight.   All questions and concerns addressed. Encouraged to call with questions and/or concerns. PMT card previously provided.   Length of Stay: 1  Current Medications: Scheduled Meds:   aspirin  81 mg Oral q  AM   Chlorhexidine Gluconate Cloth  6 each Topical Daily   gabapentin  100 mg Oral BID   metoprolol succinate  25 mg Oral q AM   sertraline  25 mg Oral q AM   sodium chloride flush  3 mL Intravenous Q12H    Continuous Infusions:  ceFEPime (MAXIPIME) IV 2 g (05/23/21 0829)   lactated ringers 100 mL/hr at 05/22/21 1721   [START ON 05/24/2021] vancomycin      PRN Meds: haloperidol lactate  Physical Exam Vitals and nursing note reviewed.  Constitutional:      General: She is not in acute distress.    Appearance: She is ill-appearing.  Pulmonary:     Effort: No respiratory distress.  Skin:    General: Skin is warm and dry.     Findings: Ecchymosis present.  Neurological:     Mental Status: She is lethargic, disoriented and confused.     Motor: Weakness present.  Psychiatric:        Mood and Affect: Mood is anxious.        Cognition and Memory: Cognition is impaired. Memory is impaired.        Judgment: Judgment is impulsive.            Vital Signs: BP (!) 144/74 (BP Location: Right Arm)   Pulse 89   Temp 97.7 F (36.5 C) (Oral)   Resp 18   Ht 5' (1.524 m)   Wt 54 kg   SpO2 (!) 84%   BMI 23.25 kg/m  SpO2: SpO2: (!) 84 % O2 Device: O2 Device: Nasal Cannula O2 Flow Rate: O2 Flow Rate (L/min): 2 L/min  Intake/output summary:  Intake/Output Summary (Last 24 hours) at 05/23/2021 1126 Last data filed at 05/23/2021 0551 Gross per 24 hour  Intake 3096.95 ml  Output 450 ml  Net 2646.95 ml   LBM:   Baseline Weight: Weight: 54 kg Most recent weight: Weight: 54 kg       Palliative Assessment/Data: PPS 10%    Flowsheet Rows    Flowsheet Row Most Recent Value  Intake Tab   Referral Department Hospitalist  Unit at Time of Referral ER  Palliative Care Primary Diagnosis Sepsis/Infectious Disease  Date Notified 05/22/21  Palliative Care Type New Palliative care  Reason for referral Clarify Goals of Care  Date of Admission 05/22/21  Date first  seen by Palliative Care  05/22/21  # of days Palliative referral response time 0 Day(s)  # of days IP prior to Palliative referral 0  Clinical Assessment   Psychosocial & Spiritual Assessment   Palliative Care Outcomes   Patient/Family meeting held? Yes  Who was at the meeting? daughter, son in law  [place] goals of care, Counseled regarding hospice, Provided psychosocial or spiritual support, Completed durable DNR, Transitioned to hospice  Patient/Family wishes: Interventions discontinued/not started  Mechanical Ventilation, BiPAP, Tube feedings/TPN, NIPPV, Hemodialysis, Transfusion, Trach, PEG, Vasopressors, Transfer out of ICU       Patient Active Problem List   Diagnosis Date Noted   Sepsis due to pneumonia ([place]) 05/22/2021   Fall at nursing home 05/22/2021   Goals of care, counseling/discussion 05/22/2021   Essential hypertension 05/22/2021   PAF (paroxysmal atrial fibrillation) ([place]) 05/22/2021   Dementia with behavioral disturbance 05/22/2021    Palliative Care Assessment & Plan   Patient Profile: 87 y.o. female  with past medical history of significant of afib, chronic diastolic CHF, HTN, and lung/uterine/colon CA, dementia with behavioral disturbance presented to [facility] ED on 05/22/21 from [facility] after unwitnessed fall - unknown down time. Patient was admitted on 05/22/2021 with sepsis secondary to pneumonia and fall.   ED Course: Concern for sepsis, likely secondary to PNA.  Unwitnessed fall at [facility].  Hypothermic to 92 degrees.  Has been weaning off O2 since the last hospitalization, anxious without it. 84% without oxygen, improved on 2L.  Xrays pending for trauma.  Head CT negative.  CT spine with progressive widening, will d/w neurosurgery.  Has had 1500cc IVF, Vanc, Cefepime for HCAP.  Needs her head sutured.  Assessment: Sepsis Pneumonia Atrial fibrillation Left clavicular fracture Terminal care   Recommendations/Plan: Initiated full comfort measures Continue  DNR/DNI as previously documented Family now request patient transfer to [facility] instead of back to [place] with hospice. Transfer to [facility] when bed available TOC and [place] liaison notified and TOC consulted for: [place] placement Added orders for EOL symptom management and to reflect full comfort measures, as well as discontinued orders that were not focused on comfort Unrestricted visitation orders were placed per current [place] EOL visitation policy  Nursing to provide frequent assessments and administer PRN medications as clinically necessary to ensure EOL comfort PMT will continue to follow and support holistically   Goals of Care and Additional Recommendations: Limitations on Scope of Treatment: Full Comfort Care  Code Status:    Code Status Orders  (From admission, onward)           Start     Ordered   05/22/21 1226  Do not attempt resuscitation (DNR)  Continuous       Question Answer Comment  In the event of cardiac or respiratory ARREST Do not call a "code blue"   In the event of cardiac or respiratory ARREST Do not perform Intubation, CPR, defibrillation or ACLS   In the event of cardiac or respiratory ARREST Use medication by any route, position, wound care, and other measures to relive pain and suffering. May use oxygen, suction and manual treatment of airway obstruction as needed for comfort.      05/22/21 1226           Code Status History     This patient has a current code status but no historical code status.       Prognosis:  < 2 weeks  Discharge  Planning: Hospice facility  Care plan was discussed with primary RN, patient's family, [place] liaison, TOC, Dr. Verlon Au  Thank you for allowing the Palliative Medicine Team to assist in the care of this patient.   Total Time 67 minutes Prolonged Time Billed  yes       Greater than 50%  of this time was spent counseling and coordinating care related to the above  assessment and plan.  Lin Landsman, NP  Please contact Palliative Medicine Team phone at [phone] for questions and concerns.

## 2021-05-23 NOTE — Progress Notes (Signed)
Manufacturing engineer [facility]) Hospital Liaison note.      Received request from [place] for family interest in [facility] instead of returning to [place] [facility]F with hospice.  Patient information has been forwarded to [facility] for review.  Eligibility confirmed.     Adrian will follow up tomorrow or sooner if a room becomes available.   A Please do not hesitate to call with questions.     Thank you,    Farrel Gordon, RN, [place] (listed on [facility] under [place])     [phone]

## 2021-05-23 NOTE — Progress Notes (Signed)
Speech Language Pathology Treatment: Dysphagia  Patient Details Name: [name] MRN: [id number] DOB: [birth year] Today's Date: 05/23/2021 Time: 6384-6659 SLP Time Calculation (min) (ACUTE ONLY): 19 min  Assessment / Plan / Recommendation Clinical Impression  Pt seen this am for dysphagia therapy with discussion of GOC with RN and family. Pt reluctant to consume any POs, but consented to a few sips of thin/NTL and small bites of puree. Multiple small cup sips of thin liquids consumed with pt exhibiting multiple swallows, consistent belching and intermittent throat clearing, concerning for decreased airway protection. Vocal quality remained clear. NTL via cup had similar presentation at bedside. Bites of puree consumed without overt s/sx of aspiration. While clinical s/sx appear improved from initial evaluation, cannot r/o aspiration without instrumental. Discussed GOC with daughter who did not desire completion of instrumental swallow assessment at this time, but to focus on pt's comfort. Given clinical presentation this date and GOC per family, recommend small bites of puree and sips of thin liquids (via cup) after oral care, for comfort and quality of life. Strongly encourage adhering to all swallow precautions with known risk for aspiration. Daughter and RN expressed agreement. Will f/u to finalize education.   HPI HPI: [name] is a 87 y.o. female with medical history significant of afib; chronic diastolic CHF; HTN; and lung/uterine/colon CA presenting with a fall.  She was previously hospitalized from 8/31-9/3 for CAP and discharged to SNF.   Since prior hospitalization, she has continued to decline.  She was discharged from SNF rehab due to lack of progression/participation but was unable to return to ALF and so has remained in SNF. Has had poor PO intake, concern for aspiration per chart review.      SLP Plan  Continue with current plan of care      Recommendations for follow up  therapy are one component of a multi-disciplinary discharge planning process, led by the attending physician.  Recommendations may be updated based on patient status, additional functional criteria and insurance authorization.    Recommendations  Diet recommendations: Dysphagia 1 (puree);Thin liquid;Other(comment) (bites and sips for comfort after oral care) Liquids provided via: Cup;No straw Medication Administration: Via alternative means Supervision: Staff to assist with self feeding;Full supervision/cueing for compensatory strategies Compensations: Slow rate;Minimize environmental distractions;Small sips/bites Postural Changes and/or Swallow Maneuvers: Seated upright 90 degrees;Upright 30-60 min after meal                Oral Care Recommendations: Oral care QID Follow up Recommendations: None SLP Visit Diagnosis: Dysphagia, unspecified (R13.10) Plan: Continue with current plan of care       [place], [place], [place] Office Number: [phone]  [name]  05/23/2021, 10:26 AM

## 2021-05-23 NOTE — Progress Notes (Signed)
Manufacturing engineer [facility]) Hospital Liaison: RN note    Notified by Transition of Care Manger of patient/family request for [facility] services at home after discharge. Chart and patient information under review by [facility] physician. Hospice eligibility confirmed.   Writer spoke with daughter to initiate education related to hospice philosophy, services and team approach to care.Daughter verbalized understanding of information given. Per discussion, plan is for discharge to home by private car.   Please send signed and completed DNR form home with patient/family. Patient will need prescriptions for discharge comfort medications.     DME needs have been discussed, patient currently has the following equipment in the home: oxygen.  Patient/family requests the following DME for delivery to the home: portable tank. [place] equipment manager has been notified and will contact DME provider to arrange delivery to the home. Home address has been verified and is correct in the chart. Daughter is the family member to contact to arrange time of delivery.     [facility] aware of the above. Please notify [facility] when patient is ready to leave the unit at discharge. (Call [phone] or [phone] after 5pm.) [facility] information and contact numbers given to daughter.      A Please do not hesitate to call with questions.    Thank you,   Farrel Gordon, RN, [place] Hospital Liaison   [phone]

## 2021-05-23 NOTE — Progress Notes (Signed)
PROGRESS NOTE   [name]  NTI:[id number] DOB: 1924/02/02 DOA: 05/22/2021 PCP: Yvonna Alanis, NP  Brief Narrative:  87 year old female A. fib CHADS2 score >4, HFpEF and HTN, prior lung Colon cancer  Recent hospitalization 831-9/3 community-acquired pneumonia Presented with a fall at facility found hypothermic to 92, also found to have a head laceration prior to admission in addition to left clavicular fracture after the fall  Hospital-Problem based course  Sepsis on admission due to pneumonia Received antibiotics on admission Goals at this time are now comfort related--antibiotics have been discontinued after extensive discussion with family by palliative care A. fib CHADS2 score >4 Toprol-XL 25, aspirin 81 held- HFpEF Hold Lasix 20 every morning and lisinopril 40-May need Foley for comfort Left clavicular fracture Not a candidate for repair-pain control with morphine-May benefit from swing   No family present-patient has elected to go to [facility] after discussions with palliative care Agree with de-escalation and comfort measures including morphine Haldol Ativan and comfort feeds  Code Status: DNR full comfort Family Communication: None present at the bedside Disposition:  Status is: Inpatient  Remains inpatient appropriate because:Inpatient level of care appropriate due to severity of illness  Dispo: The patient is from: ALF              Anticipated d/c is to: SNF              Patient currently is medically stable to d/c.   Difficult to place patient No       Consultants:  Palliative care  Procedures: No  Antimicrobials: No   Subjective: Interactive speaking in simple sentences although a little bit confused Nursing reports overall has been comfortable but not really eating  Objective: Vitals:   05/22/21 1946 05/22/21 2357 05/23/21 0547 05/23/21 0858  BP: (!) 146/72 (!) 151/81 (!) 163/80 (!) 144/74  Pulse: 94 76 83 89  Resp: 17 17 17 18   Temp: (!)  97.5 F (36.4 C) 97.6 F (36.4 C) 97.6 F (36.4 C) 97.7 F (36.5 C)  TempSrc: Oral Tympanic Tympanic Oral  SpO2: 100% 100% 99% (!) 84%  Weight:      Height:        Intake/Output Summary (Last 24 hours) at 05/23/2021 1520 Last data filed at 05/23/2021 1142 Gross per 24 hour  Intake 1596.95 ml  Output 450 ml  Net 1146.95 ml   Filed Weights   05/22/21 0900  Weight: 54 kg    Examination:  Slightly incoherent but making sense-eyes Closed during exam CTA B no added sound rales rhonchi ROM intact lower extremities Abdomen soft Perfusing well S1-S2 no murmur  Data Reviewed: personally reviewed   CBC    Component Value Date/Time   WBC 9.6 05/23/2021 0048   RBC 3.29 (L) 05/23/2021 0048   HGB 10.5 (L) 05/23/2021 0048   HCT 30.3 (L) 05/23/2021 0048   PLT 185 05/23/2021 0048   MCV 92.1 05/23/2021 0048   MCH 31.9 05/23/2021 0048   MCHC 34.7 05/23/2021 0048   RDW 13.8 05/23/2021 0048   LYMPHSABS 0.8 05/22/2021 0839   MONOABS 1.4 (H) 05/22/2021 0839   EOSABS 0.0 05/22/2021 0839   BASOSABS 0.1 05/22/2021 0839   CMP Latest Ref Rng & Units 05/23/2021 05/22/2021  Glucose 70 - 99 mg/dL 88 130(H)  BUN 8 - 23 mg/dL 13 18  Creatinine 0.44 - 1.00 mg/dL 0.69 0.91  Sodium 135 - 145 mmol/L 132(L) 134(L)  Potassium 3.5 - 5.1 mmol/L 3.9 4.5  Chloride 98 -  111 mmol/L 89(L) 86(L)  CO2 22 - 32 mmol/L 33(H) 37(H)  Calcium 8.9 - 10.3 mg/dL 8.3(L) 9.0  Total Protein 6.5 - 8.1 g/dL - 5.2(L)  Total Bilirubin 0.3 - 1.2 mg/dL - 1.3(H)  Alkaline Phos 38 - 126 U/L - 75  AST 15 - 41 U/L - 27  ALT 0 - 44 U/L - 24     Radiology Studies: DG Knee 2 Views Left  Result Date: 05/22/2021 CLINICAL DATA:  Status post fall. Forehead hematoma. Bilateral shoulder bruising. EXAM: LEFT KNEE - 1-2 VIEW COMPARISON:  None. FINDINGS: Left total knee prosthesis is well seated without periprosthetic fracture or lucency. Soft tissues are unremarkable. IMPRESSION: No acute abnormality of the left knee.  Electronically Signed   By: Miachel Roux M.D.   On: 05/22/2021 11:51   DG Knee 2 Views Right  Result Date: 05/22/2021 CLINICAL DATA:  Unwitnessed fall. Forehead hematoma. Bilateral shoulder bruising. EXAM: RIGHT KNEE - 1-2 VIEW COMPARISON:  None. FINDINGS: Diffuse osteopenia. Chondrocalcinosis present in the medial and lateral compartments. No fracture or dislocation. Soft tissues are unremarkable. Atherosclerotic changes seen throughout visualized arterial segments. IMPRESSION: No acute abnormality of the right knee. Electronically Signed   By: Miachel Roux M.D.   On: 05/22/2021 11:53   CT HEAD WO CONTRAST (5MM)  Result Date: 05/22/2021 CLINICAL DATA:  Fall EXAM: CT HEAD WITHOUT CONTRAST TECHNIQUE: Contiguous axial images were obtained from the base of the skull through the vertex without intravenous contrast. COMPARISON:  CT head 12/03/2020 FINDINGS: Brain: There is no acute intracranial hemorrhage, extra-axial fluid collection, or acute infarct. There is unchanged mild global parenchymal volume loss and moderate chronic white matter microangiopathy. The ventricles are stable in size. There is no mass lesion. There is no midline shift. Vascular: No hyperdense vessel or unexpected calcification. Skull: There is a moderate size right frontal scalp hematoma without underlying calvarial fracture. There are no suspicious lesions. Sinuses/Orbits: The paranasal sinuses are clear. Bilateral lens implants are in place. The globes and orbits are otherwise unremarkable. Other: The patient is status post right canal wall down mastoidectomy. Fluid opacification of the residual mastoid air cells and middle ear cavity has worsened since 12/03/2020. There is advanced degenerative change of the temporomandibular joints. IMPRESSION: 1. No acute intracranial hemorrhage. 2. Moderate frontal scalp hematoma without underlying calvarial fracture. 3. Status post right canal wall down mastoidectomy with increased opacification of  the residual mastoid air cells and middle ear cavity. Correlate with any symptoms and consider dedicated outpatient imaging of the temporal bone as indicated. Electronically Signed   By: Valetta Mole M.D.   On: 05/22/2021 10:08   CT Cervical Spine Wo Contrast  Result Date: 05/22/2021 CLINICAL DATA:  Trauma EXAM: CT CERVICAL SPINE WITHOUT CONTRAST TECHNIQUE: Multidetector CT imaging of the cervical spine was performed without intravenous contrast. Multiplanar CT image reconstructions were also generated. COMPARISON:  CT cervical spine 12/03/2020 FINDINGS: Alignment: There is exaggerated cervical spine lordosis, similar to the prior study. There is unchanged grade 1 anterolisthesis of C4 on C5, C5 on C6, C6 on C7, and C7 on T1, unchanged and likely degenerative in nature. There is slight asymmetric widening of the left C2-C3 facet joint, increased since the prior study. There is no evidence of traumatic malalignment. Skull base and vertebrae: The bones are diffusely demineralized. Skull base alignment is maintained. Vertebral body heights are preserved. There is no evidence of acute fracture. Soft tissues and spinal canal: No prevertebral fluid or swelling. No visible canal hematoma. Disc  levels: There is multilevel intervertebral disc space narrowing with associated uncovertebral and facet arthropathy, overall similar to the prior study. Facet arthropathy is most advanced at C3-C4 and C4-C5, worse on the left. The osseous spinal canal is patent. Upper chest: An area of scarring in the medial right apex is unchanged going back to 2018. There are bilateral pleural effusions, incompletely imaged. Other: There is a 2.1 cm left thyroid nodule. 2.1 cm incidental left thyroid nodule. Recommend thyroid US if clinically warranted given patient age. Reference: J Am Coll Radiol. 2015 Feb;12(2): 143-50 IMPRESSION: 1. No acute fracture or traumatic malalignment of the cervical spine. 2. Slight asymmetric widening of the left  C2-C3 facet joint is increased since 12/03/2020 and may be due to progressive degenerative change or related to the recent fall. 3. Multilevel spondylosis and spondylolisthesis as detailed above, not significantly changed. 4. Bilateral pleural effusions, incompletely imaged. Electronically Signed   By: Valetta Mole M.D.   On: 05/22/2021 10:16   DG Chest Port 1 View  Result Date: 05/22/2021 CLINICAL DATA:  Fall, confusion, and weakness.  Hypoxia. EXAM: PORTABLE CHEST 1 VIEW COMPARISON:  CTA chest and chest x-ray dated April 24, 2021. FINDINGS: Unchanged moderate cardiomegaly. Normal pulmonary vascularity. Unchanged small bilateral pleural effusions and left basilar atelectasis. Similar pleuroparenchymal scarring at the right lung base. No pneumothorax. No acute osseous abnormality. IMPRESSION: 1. Unchanged small bilateral pleural effusions and left basilar atelectasis. Electronically Signed   By: Titus Dubin M.D.   On: 05/22/2021 09:22   DG Shoulder Left  Result Date: 05/22/2021 CLINICAL DATA:  Bilateral shoulder bruising status post fall. EXAM: LEFT SHOULDER - 2+ VIEW COMPARISON:  None. FINDINGS: Diffuse osteopenia. Mild deformity of the lateral head of the clavicle suspicious for nondisplaced fracture. There is otherwise no acute fracture or dislocation. Visualized soft tissues are unremarkable. IMPRESSION: Irregularity of the lateral clavicular head near the [facility] joint is suspicious for fracture. Please correlate for focal tenderness. Electronically Signed   By: Miachel Roux M.D.   On: 05/22/2021 11:54     Scheduled Meds:  antiseptic oral rinse  15 mL Topical BID   sodium chloride flush  3 mL Intravenous Q12H   Continuous Infusions:   LOS: 1 day   Time spent: [place], MD [facility] To contact the attending provider between 7A-7P or the covering provider during after hours 7P-7A, please log into the web site [email or web address] and access using universal   password for that web site. If you do not have the password, please call the hospital operator.  05/23/2021, 3:20 PM

## 2021-05-24 DIAGNOSIS — F03918 Unspecified dementia, unspecified severity, with other behavioral disturbance: Secondary | ICD-10-CM | POA: Diagnosis not present

## 2021-05-24 DIAGNOSIS — S0083XA Contusion of other part of head, initial encounter: Secondary | ICD-10-CM | POA: Diagnosis not present

## 2021-05-24 DIAGNOSIS — A419 Sepsis, unspecified organism: Secondary | ICD-10-CM | POA: Diagnosis not present

## 2021-05-24 DIAGNOSIS — W19XXXA Unspecified fall, initial encounter: Secondary | ICD-10-CM | POA: Diagnosis not present

## 2021-05-24 DIAGNOSIS — J189 Pneumonia, unspecified organism: Secondary | ICD-10-CM | POA: Diagnosis not present

## 2021-05-24 MED ORDER — GLYCOPYRROLATE 1 MG PO TABS: 1.0000 mg | ORAL_TABLET | ORAL | Status: AC | PRN

## 2021-05-24 MED ORDER — LORAZEPAM 2 MG/ML PO CONC: 1.0000 mg | ORAL | 0 refills | Status: AC | PRN

## 2021-05-24 MED ORDER — MORPHINE SULFATE (PF) 2 MG/ML IV SOLN: 2.0000 mg | INTRAVENOUS | 0 refills | Status: AC | PRN

## 2021-05-24 NOTE — Progress Notes (Signed)
[facility] Liaison Note  [facility] is able to offer a bed today. Family has accepted the room offer and consents are completed. Transportation has been called for.  Please send signed and completed DNR with patient.   RN please call report to [phone].  Please call with any hospice questions or concerns.  Thank you. Margaretmary Eddy, RN, BSN [facility] Liaison [phone]

## 2021-05-24 NOTE — Discharge Summary (Signed)
Physician Discharge Summary  [name] UUV:[id number] DOB: [birth year] DOA: 05/22/2021  PCP: Yvonna Alanis, NP  Admit date: 05/22/2021 Discharge date: 05/24/2021  Time spent: 26 minutes  Recommendations for Outpatient Follow-up:  Discharging to [facility] hosp[facility] for end-of-life care  Discharge Diagnoses:  MAIN problem for hospitalization   Likely aspiration pneumonia  Please see below for itemized issues addressed in [place]- refer to other progress notes for clarity if needed  Discharge Condition: Guarded   Diet recommendation: Comfort  Filed Weights   05/22/21 0900  Weight: 54 kg    History of present illness:  87 year old female A. fib CHADS2 score >4, HFpEF and HTN, prior lung Colon cancer  Recent hospitalization 831-9/3 community-acquired pneumonia Presented with a fall at facility found hypothermic to 92, also found to have a head laceration prior to admission in addition to left clavicular fracture after the fall  Sepsis on admission due to pneumonia Received antibiotics on admission Goals at this time are now comfort related--antibiotics have been discontinued after extensive discussion with family by palliative care Agree with de-escalation and comfort measures including morphine Haldol Ativan and comfort feeds Consider Foley placement at freestanding hospice facility  A. fib CHADS2 score >4 Toprol-XL 25, aspirin 81 held from admission as goals are comfort HFpEF Hold Lasix 20 every morning and lisinopril 40-May need Foley for comfort Left clavicular fracture Not a candidate for repair-pain control with morphine-May benefit from swing Laceration over right frontal head area Sutured in the emergency room-no further work-up     No family present-patient has elected to go to [facility] after discussions with palliative care   Discharge Exam: Vitals:   05/24/21 0634 05/24/21 0820  BP: 138/66 (!) 130/58  Pulse: 99 80  Resp: 19 18  Temp: (!) 97.5 F  (36.4 C) (!) 97.5 F (36.4 C)  SpO2: (!) 69% 99%    Subj on day of d/c   Sleepy no distress  General Exam on discharge  Arouses but no distress no fever no chills No appreciable increased work of breathing S1-S2 no murmur Abdomen soft She does have some flexion in the lower extremities she has heel dressings in place  Discharge Instructions   Discharge Instructions     Diet - low sodium heart healthy   Complete by: As directed    Increase activity slowly   Complete by: As directed    No wound care   Complete by: As directed       Allergies as of 05/24/2021       Reactions   Celebrex [celecoxib] Other (See Comments)   "Allergic," per [facility]   Merthiolate Glycerite [thimerosal] Other (See Comments)   "Allergic," per [facility]   Neomycin Other (See Comments)   "Allergic," per [facility]   Penicillins Hives, Other (See Comments)   "Allergic," per [facility]   Tape Rash, Other (See Comments)   "Allergic," per [facility]        Medication List     STOP taking these medications    aspirin 81 MG chewable tablet   Centrum Adults Chew   diclofenac Sodium 1 % Gel Commonly known as: VOLTAREN   furosemide 20 MG tablet Commonly known as: LASIX   gabapentin 100 MG capsule Commonly known as: NEURONTIN   hydrALAZINE 10 MG tablet Commonly known as: APRESOLINE   lisinopril 40 MG tablet Commonly known as: ZESTRIL   LORazepam 0.5 MG tablet Commonly known as: ATIVAN Replaced by: LORazepam 2 MG/ML concentrated solution   metoprolol succinate 25  MG 24 hr tablet Commonly known as: TOPROL-XL   sertraline 25 MG tablet Commonly known as: ZOLOFT   Vitamin D-3 25 MCG (1000 UT) Caps   zinc oxide 20 % ointment       TAKE these medications    glycopyrrolate 1 MG tablet Commonly known as: ROBINUL Take 1 tablet (1 mg total) by mouth every 4 (four) hours as needed (excessive secretions).   LORazepam 2 MG/ML concentrated solution Commonly known as: ATIVAN Place 0.5 mLs (1 mg total)  under the tongue every hour as needed for anxiety, seizure or sedation. Replaces: LORazepam 0.5 MG tablet   morphine 2 MG/ML injection Inject 1 mL (2 mg total) into the vein every 2 (two) hours as needed (distress, increased work of breathing, respiratory rate >25, dyspnea).       Allergies  Allergen Reactions   Celebrex [Celecoxib] Other (See Comments)    "Allergic," per [facility]   Merthiolate Glycerite [Thimerosal] Other (See Comments)    "Allergic," per [facility]   Neomycin Other (See Comments)    "Allergic," per [facility]   Penicillins Hives and Other (See Comments)    "Allergic," per [facility]   Tape Rash and Other (See Comments)    "Allergic," per [facility]      The results of significant diagnostics from this hospitalization (including imaging, microbiology, ancillary and laboratory) are listed below for reference.    Significant Diagnostic Studies: DG Knee 2 Views Left  Result Date: 05/22/2021 CLINICAL DATA:  Status post fall. Forehead hematoma. Bilateral shoulder bruising. EXAM: LEFT KNEE - 1-2 VIEW COMPARISON:  None. FINDINGS: Left total knee prosthesis is well seated without periprosthetic fracture or lucency. Soft tissues are unremarkable. IMPRESSION: No acute abnormality of the left knee. Electronically Signed   By: Miachel Roux M.D.   On: 05/22/2021 11:51   DG Knee 2 Views Right  Result Date: 05/22/2021 CLINICAL DATA:  Unwitnessed fall. Forehead hematoma. Bilateral shoulder bruising. EXAM: RIGHT KNEE - 1-2 VIEW COMPARISON:  None. FINDINGS: Diffuse osteopenia. Chondrocalcinosis present in the medial and lateral compartments. No fracture or dislocation. Soft tissues are unremarkable. Atherosclerotic changes seen throughout visualized arterial segments. IMPRESSION: No acute abnormality of the right knee. Electronically Signed   By: Miachel Roux M.D.   On: 05/22/2021 11:53   CT HEAD WO CONTRAST (5MM)  Result Date: 05/22/2021 CLINICAL DATA:  Fall EXAM: CT HEAD WITHOUT CONTRAST TECHNIQUE: Contiguous  axial images were obtained from the base of the skull through the vertex without intravenous contrast. COMPARISON:  CT head 12/03/2020 FINDINGS: Brain: There is no acute intracranial hemorrhage, extra-axial fluid collection, or acute infarct. There is unchanged mild global parenchymal volume loss and moderate chronic white matter microangiopathy. The ventricles are stable in size. There is no mass lesion. There is no midline shift. Vascular: No hyperdense vessel or unexpected calcification. Skull: There is a moderate size right frontal scalp hematoma without underlying calvarial fracture. There are no suspicious lesions. Sinuses/Orbits: The paranasal sinuses are clear. Bilateral lens implants are in place. The globes and orbits are otherwise unremarkable. Other: The patient is status post right canal wall down mastoidectomy. Fluid opacification of the residual mastoid air cells and middle ear cavity has worsened since 12/03/2020. There is advanced degenerative change of the temporomandibular joints. IMPRESSION: 1. No acute intracranial hemorrhage. 2. Moderate frontal scalp hematoma without underlying calvarial fracture. 3. Status post right canal wall down mastoidectomy with increased opacification of the residual mastoid air cells and middle ear cavity. Correlate with any symptoms and consider dedicated  outpatient imaging of the temporal bone as indicated. Electronically Signed   By: Valetta Mole M.D.   On: 05/22/2021 10:08   CT Cervical Spine Wo Contrast  Result Date: 05/22/2021 CLINICAL DATA:  Trauma EXAM: CT CERVICAL SPINE WITHOUT CONTRAST TECHNIQUE: Multidetector CT imaging of the cervical spine was performed without intravenous contrast. Multiplanar CT image reconstructions were also generated. COMPARISON:  CT cervical spine 12/03/2020 FINDINGS: Alignment: There is exaggerated cervical spine lordosis, similar to the prior study. There is unchanged grade 1 anterolisthesis of C4 on C5, C5 on C6, C6 on C7,  and C7 on T1, unchanged and likely degenerative in nature. There is slight asymmetric widening of the left C2-C3 facet joint, increased since the prior study. There is no evidence of traumatic malalignment. Skull base and vertebrae: The bones are diffusely demineralized. Skull base alignment is maintained. Vertebral body heights are preserved. There is no evidence of acute fracture. Soft tissues and spinal canal: No prevertebral fluid or swelling. No visible canal hematoma. Disc levels: There is multilevel intervertebral disc space narrowing with associated uncovertebral and facet arthropathy, overall similar to the prior study. Facet arthropathy is most advanced at C3-C4 and C4-C5, worse on the left. The osseous spinal canal is patent. Upper chest: An area of scarring in the medial right apex is unchanged going back to 2018. There are bilateral pleural effusions, incompletely imaged. Other: There is a 2.1 cm left thyroid nodule. 2.1 cm incidental left thyroid nodule. Recommend thyroid US if clinically warranted given patient age. Reference: J Am Coll Radiol. 2015 Feb;12(2): 143-50 IMPRESSION: 1. No acute fracture or traumatic malalignment of the cervical spine. 2. Slight asymmetric widening of the left C2-C3 facet joint is increased since 12/03/2020 and may be due to progressive degenerative change or related to the recent fall. 3. Multilevel spondylosis and spondylolisthesis as detailed above, not significantly changed. 4. Bilateral pleural effusions, incompletely imaged. Electronically Signed   By: Valetta Mole M.D.   On: 05/22/2021 10:16   DG Chest Port 1 View  Result Date: 05/22/2021 CLINICAL DATA:  Fall, confusion, and weakness.  Hypoxia. EXAM: PORTABLE CHEST 1 VIEW COMPARISON:  CTA chest and chest x-ray dated April 24, 2021. FINDINGS: Unchanged moderate cardiomegaly. Normal pulmonary vascularity. Unchanged small bilateral pleural effusions and left basilar atelectasis. Similar pleuroparenchymal  scarring at the right lung base. No pneumothorax. No acute osseous abnormality. IMPRESSION: 1. Unchanged small bilateral pleural effusions and left basilar atelectasis. Electronically Signed   By: Titus Dubin M.D.   On: 05/22/2021 09:22   DG Shoulder Left  Result Date: 05/22/2021 CLINICAL DATA:  Bilateral shoulder bruising status post fall. EXAM: LEFT SHOULDER - 2+ VIEW COMPARISON:  None. FINDINGS: Diffuse osteopenia. Mild deformity of the lateral head of the clavicle suspicious for nondisplaced fracture. There is otherwise no acute fracture or dislocation. Visualized soft tissues are unremarkable. IMPRESSION: Irregularity of the lateral clavicular head near the [facility] joint is suspicious for fracture. Please correlate for focal tenderness. Electronically Signed   By: Miachel Roux M.D.   On: 05/22/2021 11:54    Microbiology: Recent Results (from the past 240 hour(s))  Urine Culture     Status: None   Collection Time: 05/22/21  8:43 AM   Specimen: In/Out Cath Urine  Result Value Ref Range Status   Specimen Description IN/OUT CATH URINE  Final   Special Requests NONE  Final   Culture   Final    NO GROWTH Performed at [facility], [address] [place], [place]  [address]    Report Status 05/23/2021 FINAL  Final  Resp Panel by RT-PCR (Flu A&B, Covid) Nasopharyngeal Swab     Status: None   Collection Time: 05/22/21  9:02 AM   Specimen: Nasopharyngeal Swab; Nasopharyngeal(NP) swabs in vial transport medium  Result Value Ref Range Status   SARS Coronavirus 2 by RT PCR NEGATIVE NEGATIVE Final    Comment: (NOTE) SARS-CoV-2 target nucleic acids are NOT DETECTED.  The SARS-CoV-2 RNA is generally detectable in upper respiratory specimens during the acute phase of infection. The lowest concentration of SARS-CoV-2 viral copies this assay can detect is 138 copies/mL. A negative result does not preclude SARS-Cov-2 infection and should not be used as the sole basis for treatment or other  patient management decisions. A negative result may occur with  improper specimen collection/handling, submission of specimen other than nasopharyngeal swab, presence of viral mutation(s) within the areas targeted by this assay, and inadequate number of viral copies(<138 copies/mL). A negative result must be combined with clinical observations, patient history, and epidemiological information. The expected result is Negative.  Fact Sheet for Patients:  EntrepreneurPulse.com.au  Fact Sheet for Healthcare Providers:  IncredibleEmployment.be  This test is no t yet approved or cleared by the [place] FDA and  has been authorized for detection and/or diagnosis of SARS-CoV-2 by FDA under an Emergency Use Authorization (EUA). This EUA will remain  in effect (meaning this test can be used) for the duration of the COVID-19 declaration under Section 564(b)(1) of the Act, 21 U.S.C.section 360bbb-3(b)(1), unless the authorization is terminated  or revoked sooner.       Influenza A by PCR NEGATIVE NEGATIVE Final   Influenza B by PCR NEGATIVE NEGATIVE Final    Comment: (NOTE) The Xpert Xpress SARS-CoV-2/FLU/RSV plus assay is intended as an aid in the diagnosis of influenza from Nasopharyngeal swab specimens and should not be used as a sole basis for treatment. Nasal washings and aspirates are unacceptable for Xpert Xpress SARS-CoV-2/FLU/RSV testing.  Fact Sheet for Patients: EntrepreneurPulse.com.au  Fact Sheet for Healthcare Providers: IncredibleEmployment.be  This test is not yet approved or cleared by the [place] FDA and has been authorized for detection and/or diagnosis of SARS-CoV-2 by FDA under an Emergency Use Authorization (EUA). This EUA will remain in effect (meaning this test can be used) for the duration of the COVID-19 declaration under Section 564(b)(1) of the Act, 21 U.S.C. section  360bbb-3(b)(1), unless the authorization is terminated or revoked.  Performed at [facility], [place] [address], [place], [place] [address]   Blood Culture (routine x 2)     Status: None (Preliminary result)   Collection Time: 05/22/21  9:02 AM   Specimen: BLOOD  Result Value Ref Range Status   Specimen Description BLOOD SITE NOT SPECIFIED  Final   Special Requests   Final    BOTTLES DRAWN AEROBIC AND ANAEROBIC Blood Culture results may not be optimal due to an inadequate volume of blood received in culture bottles   Culture   Final    NO GROWTH 1 DAY Performed at [facility], [place] [address] [place], [place] [address]    Report Status PENDING  Incomplete  Blood Culture (routine x 2)     Status: None (Preliminary result)   Collection Time: 05/22/21  9:18 AM   Specimen: BLOOD RIGHT HAND  Result Value Ref Range Status   Specimen Description BLOOD RIGHT HAND  Final   Special Requests   Final    BOTTLES  DRAWN AEROBIC AND ANAEROBIC Blood Culture results may not be optimal due to an inadequate volume of blood received in culture bottles   Culture   Final    NO GROWTH < 24 HOURS Performed at [facility] [address] [place], [place] [address]    Report Status PENDING  Incomplete  MRSA Next Gen by PCR, Nasal     Status: None   Collection Time: 05/23/21 10:04 AM   Specimen: Nasal Mucosa; Nasal Swab  Result Value Ref Range Status   MRSA by PCR Next Gen NOT DETECTED NOT DETECTED Final    Comment: (NOTE) The GeneXpert MRSA Assay (FDA approved for NASAL specimens only), is one component of a comprehensive MRSA colonization surveillance program. It is not intended to diagnose MRSA infection nor to guide or monitor treatment for MRSA infections. Test performance is not FDA approved in patients less than 13 years old. Performed at [facility], [place] [address], [place], [place] [address]      Labs: Basic Metabolic Panel: Recent Labs  Lab 05/22/21 0839  05/23/21 0048  NA 134* 132*  K 4.5 3.9  CL 86* 89*  CO2 37* 33*  GLUCOSE 130* 88  BUN 18 13  CREATININE 0.91 0.69  CALCIUM 9.0 8.3*   Liver Function Tests: Recent Labs  Lab 05/22/21 0839  AST 27  ALT 24  ALKPHOS 75  BILITOT 1.3*  PROT 5.2*  ALBUMIN 2.9*   No results for input(s): LIPASE, AMYLASE in the last 168 hours. No results for input(s): AMMONIA in the last 168 hours. CBC: Recent Labs  Lab 05/22/21 0839 05/23/21 0048  WBC 15.4* 9.6  NEUTROABS 13.0*  --   HGB 12.5 10.5*  HCT 36.7 30.3*  MCV 93.1 92.1  PLT 188 185   Cardiac Enzymes: No results for input(s): CKTOTAL, CKMB, CKMBINDEX, TROPONINI in the last 168 hours. BNP: BNP (last 3 results) No results for input(s): BNP in the last 8760 hours.  ProBNP (last 3 results) No results for input(s): PROBNP in the last 8760 hours.  CBG: No results for input(s): GLUCAP in the last 168 hours.     Signed:  Nita Sells MD   [facility] 05/24/2021, 8:46 AM

## 2021-05-24 NOTE — Progress Notes (Signed)
Daily Progress Note   Patient Name: [name]       Date: 05/24/2021 DOB: 03-11-1924  Age: 87 y.o. MRN#: [id number] Attending Physician: Nita Sells, MD Primary Care Physician: Yvonna Alanis, NP Admit Date: 05/22/2021  Reason for Consultation/Follow-up: Non pain symptom management, Pain control, Psychosocial/spiritual support, and Terminal Care  Subjective: Chart review performed. Received report from primary RN - no acute concerns. RN reports patient has been sleeping all morning.  Went to visit patient at bedside - daughter/Allison and son in law/Vic present. Patient is lying in bed - she does not wake to voice/gentle touch. No signs or non-verbal gestures of pain or discomfort noted. No respiratory distress, increased work of breathing, or secretions noted.   Emotional support provided to family. Reviewed patient has been accepted to [facility] and we are waiting for a bed to become available for transfer. Prognostication and natural trajectory/expectations for EOL were reviewed again per family's request. "Gone From My Sight" book was provided. Family ask about artificial nutrition and hydration at EOL - education provided on why these are not recommended at this time. Also discussed oxygen use at EOL and how this is considered a life prolonging measure. Family would like to continue oxygen until patient gets to [facility] and then would like to discontinue use.  Hospice liaison/Tammy presented to bedside - [facility] is able to offer patient a room today. Tammy provided education on hospice care as well as transfer information.   After Tammy's visit, daughter verifies all information given to her today. Validated she remembered information correctly. She call's patient's  husband to provided updates - assisted with providing updates to patient's husband - he is agreeable for transfer to [facility] today. Ebony Hail is understandably very overwhelmed and tearful with situation. Emotional support and therapeutic listening provided throughout visit.   All questions and concerns addressed. Encouraged to call with questions and/or concerns. PMT card previously provided.  Length of Stay: 2  Current Medications: Scheduled Meds:   antiseptic oral rinse  15 mL Topical BID    Continuous Infusions:   PRN Meds: acetaminophen **OR** acetaminophen, glycopyrrolate **OR** glycopyrrolate **OR** glycopyrrolate, haloperidol **OR** haloperidol **OR** haloperidol lactate, LORazepam **OR** LORazepam **OR** LORazepam, morphine injection, ondansetron **OR** ondansetron (ZOFRAN) IV, polyvinyl alcohol  Physical Exam Vitals and nursing note reviewed.  Constitutional:  General: She is not in acute distress.    Appearance: She is ill-appearing.  Pulmonary:     Effort: No respiratory distress.  Skin:    General: Skin is warm and dry.     Findings: Ecchymosis present.  Neurological:     Mental Status: She is disoriented, confused and unresponsive.     Motor: Weakness present.  Psychiatric:        Mood and Affect: Mood is anxious.        Speech: She is noncommunicative.        Cognition and Memory: Cognition is impaired. Memory is impaired.        Judgment: Judgment is impulsive.            Vital Signs: BP (!) 130/58 (BP Location: Left Arm)   Pulse 80   Temp (!) 97.5 F (36.4 C) (Oral)   Resp 18   Ht 5' (1.524 m)   Wt 54 kg   SpO2 99%   BMI 23.25 kg/m  SpO2: SpO2: 99 % O2 Device: O2 Device: Nasal Cannula O2 Flow Rate: O2 Flow Rate (L/min): 2 L/min  Intake/output summary:  Intake/Output Summary (Last 24 hours) at 05/24/2021 3545 Last data filed at 05/24/2021 6256 Gross per 24 hour  Intake 0 ml  Output 1000 ml  Net -1000 ml   LBM:   Baseline Weight: Weight: 54  kg Most recent weight: Weight: 54 kg       Palliative Assessment/Data: PPS 10%    Flowsheet Rows    Flowsheet Row Most Recent Value  Intake Tab   Referral Department Hospitalist  Unit at Time of Referral ER  Palliative Care Primary Diagnosis Sepsis/Infectious Disease  Date Notified 05/22/21  Palliative Care Type New Palliative care  Reason for referral Clarify Goals of Care  Date of Admission 05/22/21  Date first seen by Palliative Care 05/22/21  # of days Palliative referral response time 0 Day(s)  # of days IP prior to Palliative referral 0  Clinical Assessment   Psychosocial & Spiritual Assessment   Palliative Care Outcomes   Patient/Family meeting held? Yes  Who was at the meeting? daughter, son in law  [place] goals of care, Counseled regarding hospice, Provided psychosocial or spiritual support, Completed durable DNR, Transitioned to hospice  Patient/Family wishes: Interventions discontinued/not started  Mechanical Ventilation, BiPAP, Tube feedings/TPN, NIPPV, Hemodialysis, Transfusion, Trach, PEG, Vasopressors, Transfer out of ICU       Patient Active Problem List   Diagnosis Date Noted   Sepsis due to pneumonia ([place]) 05/22/2021   Fall at nursing home 05/22/2021   Goals of care, counseling/discussion 05/22/2021   Essential hypertension 05/22/2021   PAF (paroxysmal atrial fibrillation) ([place]) 05/22/2021   Dementia with behavioral disturbance 05/22/2021    Palliative Care Assessment & Plan   Patient Profile: 87 y.o. female  with past medical history of significant of afib, chronic diastolic CHF, HTN, and lung/uterine/colon CA, dementia with behavioral disturbance presented to [facility] ED on 05/22/21 from [facility] after unwitnessed fall - unknown down time. Patient was admitted on 05/22/2021 with sepsis secondary to pneumonia and fall.   ED Course: Concern for sepsis, likely secondary to PNA.  Unwitnessed fall at [facility].  Hypothermic to 92  degrees.  Has been weaning off O2 since the last hospitalization, anxious without it. 84% without oxygen, improved on 2L.  Xrays pending for trauma.  Head CT negative.  CT spine with progressive widening, will d/w neurosurgery.  Has had 1500cc IVF, Vanc,  Cefepime for HCAP.  Needs her head sutured.    Assessment: Sepsis Pneumonia Atrial fibrillation Left clavicular fracture Terminal care  Recommendations/Plan: Continue full comfort measures Continue DNR/DNI as previously documented Plan for transfer to [facility] today, bed is available  PMT will continue to follow peripherally. If there are any imminent needs please call the service directly  Goals of Care and Additional Recommendations: Limitations on Scope of Treatment: Full Comfort Care  Code Status:    Code Status Orders  (From admission, onward)           Start     Ordered   05/23/21 1247  Do not attempt resuscitation (DNR)  Continuous       Question Answer Comment  In the event of cardiac or respiratory ARREST Do not call a "code blue"   In the event of cardiac or respiratory ARREST Do not perform Intubation, CPR, defibrillation or ACLS   In the event of cardiac or respiratory ARREST Use medication by any route, position, wound care, and other measures to relive pain and suffering. May use oxygen, suction and manual treatment of airway obstruction as needed for comfort.      05/23/21 1253           Code Status History     Date Active Date Inactive Code Status Order ID Comments User Context   05/22/2021 1226 05/23/2021 1253 DNR [id number]  Karmen Bongo, MD ED       Prognosis:  < 2 weeks  Discharge Planning: Hospice facility  Care plan was discussed with primary RN, patient's family, hospice liaison   Thank you for allowing the Palliative Medicine Team to assist in the care of this patient.   Total Time 70 minutes Prolonged Time Billed  yes       Greater than 50%  of this time was spent  counseling and coordinating care related to the above assessment and plan.  Lin Landsman, NP  Please contact Palliative Medicine Team phone at [phone] for questions and concerns.

## 2021-05-24 NOTE — Progress Notes (Signed)
[name] to be D/C'd  per MD order. Gave report to Helene Kelp, Therapist, sports at [employer].  VSS, Skin clean, dry and intact without evidence of skin break down, no evidence of skin tears noted.  IV catheter intact and saline lock. Foley still in place per request of [employer].  An After Visit Summary was printed and given to PTAR to give to [facility] on arrival.  Tollette rod in chart and signed by physician. Patient was awake, heel foam change on right heel.

## 2021-05-24 NOTE — TOC Transition Note (Addendum)
Transition of Care [facility]) - CM/SW Discharge Note   Patient Details  Name: ALITHIA ZAVALETA MRN: [id number] Date of Birth: 1923/12/22  [facility] CM/SW Contact:  Ina Homes, [place] Phone Number: 05/24/2021, 11:23 AM   Clinical Narrative:     SW informed bed available at [facility]. DNR/med nec/facesheet on chart.   Family at bedside with [facility]) completing consents. PTAR to be called once complete.  Update 1137am PTAR called  Final next level of care: [place] Barriers to Discharge: Barriers Resolved   Patient Goals and CMS Choice    Discharge Placement              Patient chooses bed at:  [facility]) Patient to be transferred to facility by: [place] Name of family member notified: family at bedside Patient and family notified of of transfer: 05/24/21  Discharge Plan and Services                                     Social Determinants of Health ([place]) Interventions     Readmission Risk Interventions No flowsheet data found.

## 2021-05-26 ENCOUNTER — Encounter: Payer: Self-pay | Admitting: Orthopedic Surgery

## 2021-05-27 LAB — CULTURE, BLOOD (ROUTINE X 2)
Culture: NO GROWTH
Culture: NO GROWTH

## 2021-06-24 DEATH — deceased

## 2021-07-15 NOTE — Progress Notes (Signed)
Pt deceased 05/25/2021.

## 2022-06-30 IMAGING — CT CT HEAD W/O CM
2 of 4 series · 12 of 47 positions shown, 15 images · non-contrast
Comparison: None.

CLINICAL DATA: Fall.  Facial trauma.

EXAM:
CT HEAD WITHOUT CONTRAST
CT MAXILLOFACIAL WITHOUT CONTRAST
CT CERVICAL SPINE WITHOUT CONTRAST
TECHNIQUE: Multidetector CT imaging of the head, cervical spine, and
maxillofacial structures were performed using the standard protocol
without intravenous contrast. Multiplanar CT image reconstructions
of the cervical spine and maxillofacial structures were also
generated.

[Series 6: coronal soft tissue · coronal · 0.33mm/px · 3 of 72 slices shown]
[im 24/72  brain]
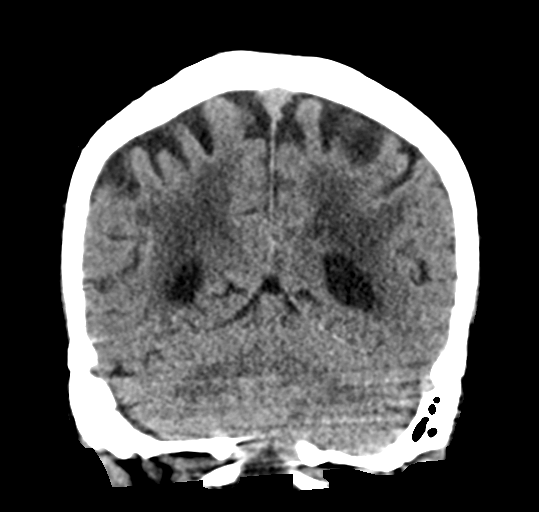
[im 32/72  brain]
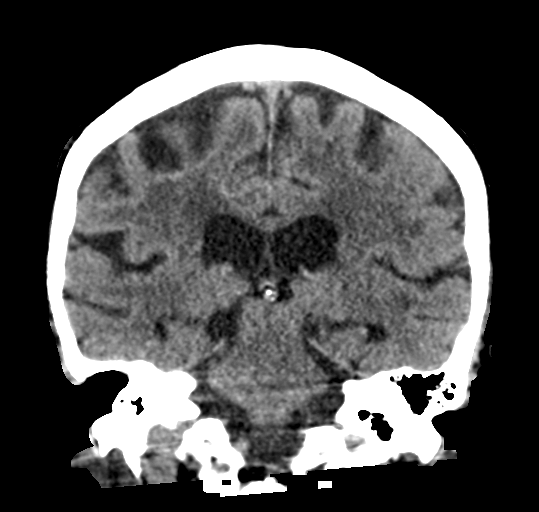
[im 40/72  brain]
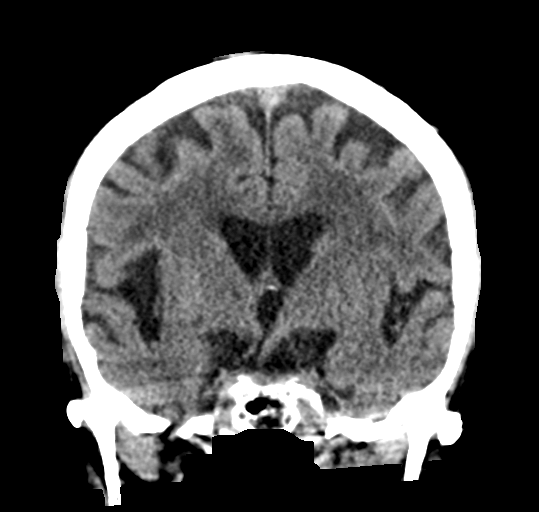

[Series 8: true axial · axial · 0.35mm/px · z∈[-158,-11]mm · 9 of 57 slices shown, 12 images]
[im 4/57  brain]
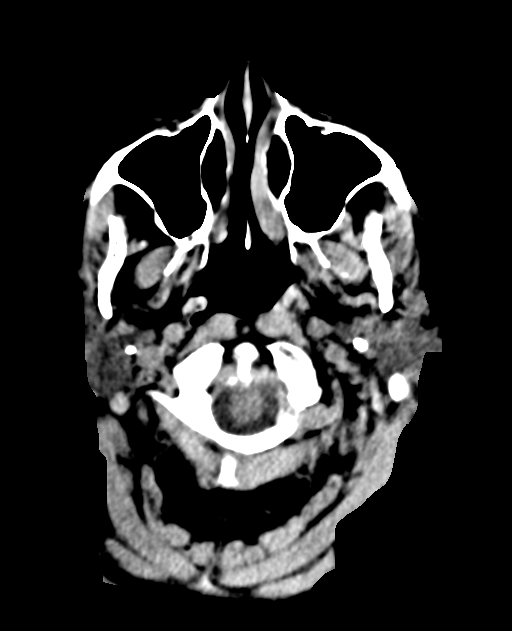
[im 4/57  bone]
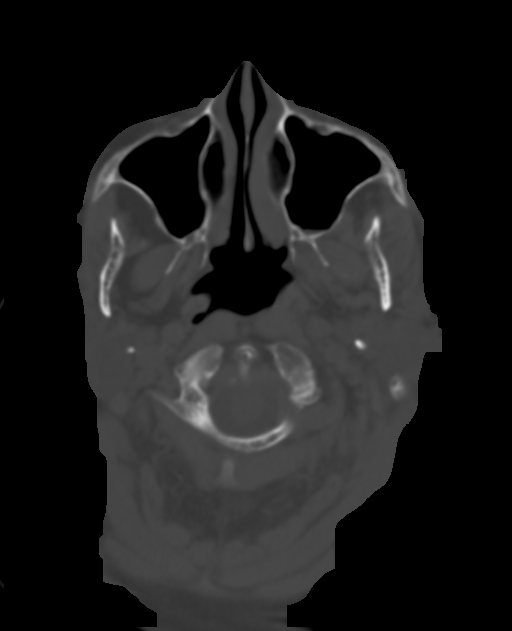
[im 10/57  brain]
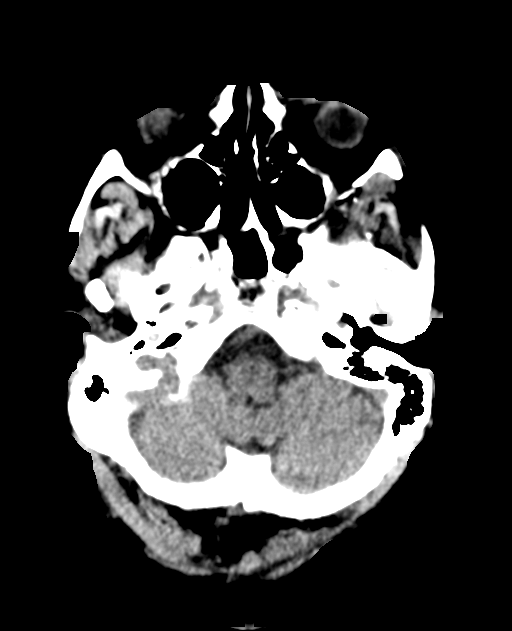
[im 16/57  brain]
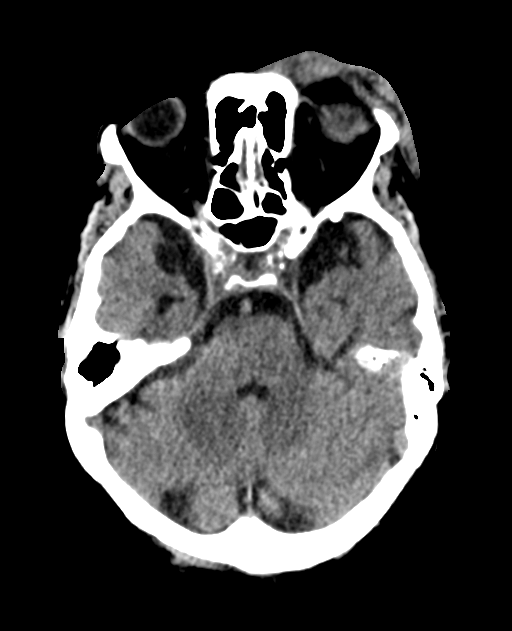
[im 22/57  brain]
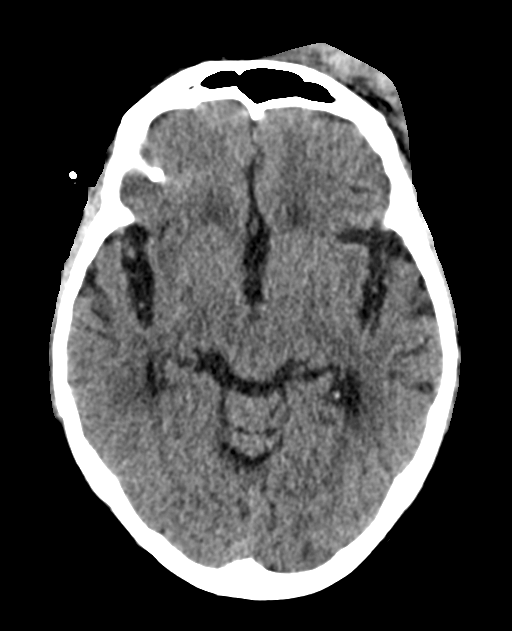
[im 29/57  brain]
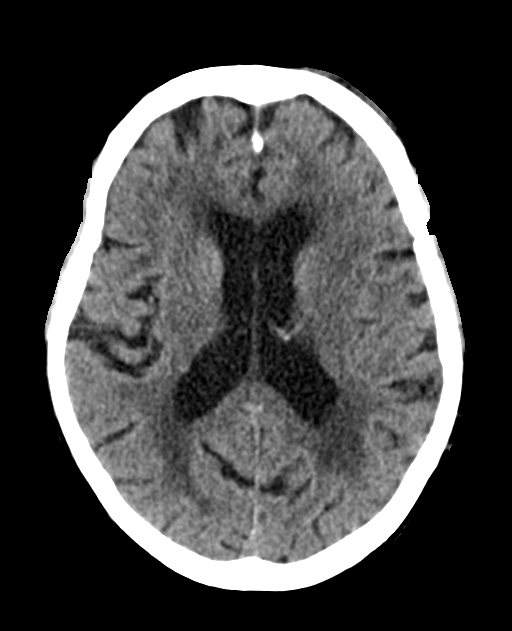
[im 29/57  bone]
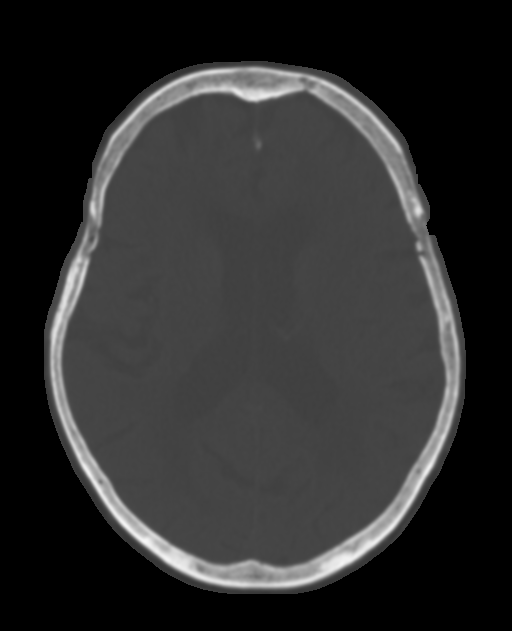
[im 35/57  brain]
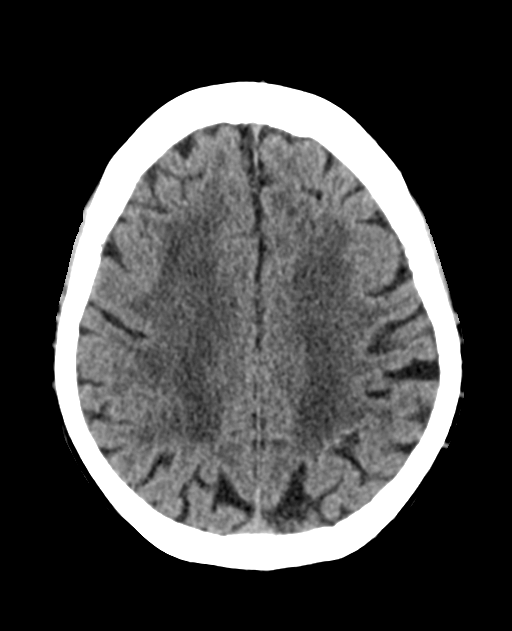
[im 41/57  brain]
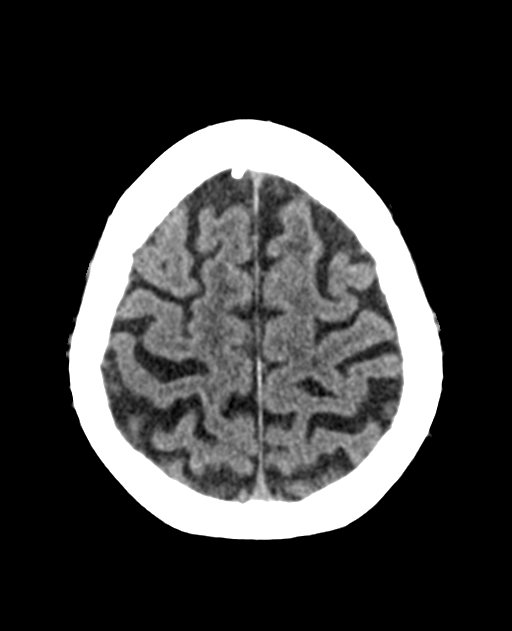
[im 47/57  brain]
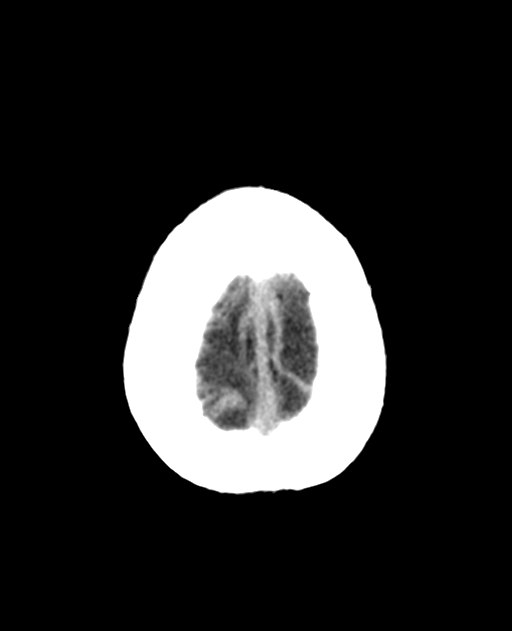
[im 53/57  brain]
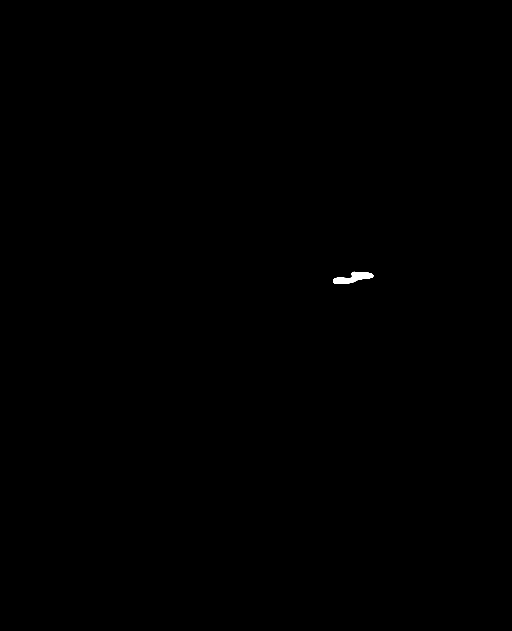
[im 53/57  bone]
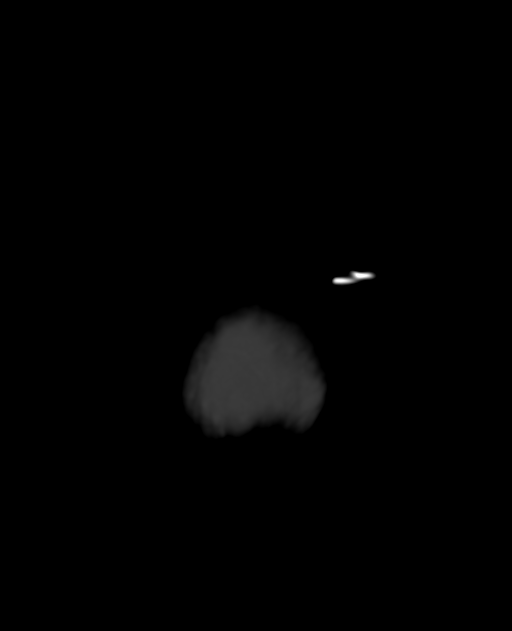

[12 of 47 positions shown; findings below may reference images not displayed]

FINDINGS: CT HEAD FINDINGS

Brain: No evidence of acute large vascular territory infarction,
hemorrhage, hydrocephalus, extra-axial collection or mass
lesion/mass effect. Patchy white matter hypoattenuation, nonspecific
but most likely related to chronic microvascular ischemic disease
given patient age.

Vascular: Calcific atherosclerosis. No hyperdense vessel identified.

Skull: Left frontal/periorbital scalp contusion without acute
fracture.

Other: Postsurgical changes of right canal wall down mastoidectomy.
No sizable mastoid effusions.

CT MAXILLOFACIAL FINDINGS

Osseous: No fracture or mandibular dislocation. No destructive
process. Bilateral temporomandibular joint degenerative change.

Orbits: No retro bulbar hematoma. Globes are symmetric within normal
limits. No proptosis. Unremarkable extraocular muscles.

Sinuses: Mild mucosal thickening of the posterior left maxillary
sinus and scattered ethmoid air cells without air-fluid levels.
Otherwise, clear sinuses.

Soft tissues: Large left frontal/periorbital contusion.

Other: Periapical lucencies of the left mandibular lateral incisor
and a left mandibular molar.

CT CERVICAL SPINE FINDINGS

Alignment: Approximately 3 mm of anterolisthesis of C4 on C5,
favored degenerative given severe facet arthropathy at this level.

Skull base and vertebrae: No evidence of acute fracture. Vertebral
body heights are maintained. Diffuse osteopenia.

Soft tissues and spinal canal: No prevertebral fluid or swelling. No
visible canal hematoma.

Disc levels: Mild-to-moderate multilevel degenerative disc disease
with posterior disc osteophyte complexes. Severe facet arthropathy
in the upper cervical spine, most pronounced at C2-C3, C3-C4, and
C4-C5. Degenerative changes at the craniocervical junction.

Upper chest: Area of scarring in the medial right upper lobe,
unchanged from 2882 CT chest. Otherwise, visualized lung apices are
clear.
IMPRESSION: CT head:

1. No evidence of acute intracranial abnormality.
2. Chronic microvascular ischemic disease and generalized atrophy.

CT maxillofacial:

1. Large left frontal/periorbital contusion without acute fracture.
2. Bilateral TMJ degenerative change.

CT cervical spine:

1. No evidence of acute fracture.
2. Approximately 3 mm of anterolisthesis of C4 on C5, favored
degenerative given severe facet arthropathy in the upper cervical
spine (including at this level).
3. Partially imaged 1.8 cm left thyroid nodule. Consider thyroid
ultrasound to further characterize.
4. Osteopenia.
